# Patient Record
Sex: Male | Born: 1960 | Race: White | Hispanic: No | Marital: Married | Smoking: Former smoker
Health system: Southern US, Community
[De-identification: ages and names within clinical notes are randomized; demographics above are authoritative.]

## PROBLEM LIST (undated history)

## (undated) DIAGNOSIS — I10 Essential (primary) hypertension: Secondary | ICD-10-CM

## (undated) DIAGNOSIS — E119 Type 2 diabetes mellitus without complications: Secondary | ICD-10-CM

## (undated) DIAGNOSIS — N189 Chronic kidney disease, unspecified: Secondary | ICD-10-CM

## (undated) DIAGNOSIS — K760 Fatty (change of) liver, not elsewhere classified: Secondary | ICD-10-CM

## (undated) DIAGNOSIS — I4891 Unspecified atrial fibrillation: Secondary | ICD-10-CM

---

## 2018-04-18 ENCOUNTER — Other Ambulatory Visit: Payer: Self-pay

## 2018-04-18 ENCOUNTER — Inpatient Hospital Stay
Admission: EM | Admit: 2018-04-18 | Discharge: 2018-04-22 | DRG: 176 | Disposition: A | Discharge status: Home / Self Care | Payer: Self-pay | Attending: Internal Medicine | Admitting: Internal Medicine

## 2018-04-18 ENCOUNTER — Emergency Department: Payer: Self-pay

## 2018-04-18 ENCOUNTER — Encounter: Payer: Self-pay | Admitting: *Deleted

## 2018-04-18 DIAGNOSIS — I259 Chronic ischemic heart disease, unspecified: Secondary | ICD-10-CM

## 2018-04-18 DIAGNOSIS — E1122 Type 2 diabetes mellitus with diabetic chronic kidney disease: Secondary | ICD-10-CM | POA: Diagnosis present

## 2018-04-18 DIAGNOSIS — Z882 Allergy status to sulfonamides status: Secondary | ICD-10-CM

## 2018-04-18 DIAGNOSIS — Z23 Encounter for immunization: Secondary | ICD-10-CM

## 2018-04-18 DIAGNOSIS — Z7984 Long term (current) use of oral hypoglycemic drugs: Secondary | ICD-10-CM

## 2018-04-18 DIAGNOSIS — I2692 Saddle embolus of pulmonary artery without acute cor pulmonale: Principal | ICD-10-CM | POA: Diagnosis present

## 2018-04-18 DIAGNOSIS — I25110 Atherosclerotic heart disease of native coronary artery with unstable angina pectoris: Secondary | ICD-10-CM | POA: Diagnosis present

## 2018-04-18 DIAGNOSIS — R079 Chest pain, unspecified: Secondary | ICD-10-CM | POA: Diagnosis present

## 2018-04-18 DIAGNOSIS — E669 Obesity, unspecified: Secondary | ICD-10-CM | POA: Diagnosis present

## 2018-04-18 DIAGNOSIS — I129 Hypertensive chronic kidney disease with stage 1 through stage 4 chronic kidney disease, or unspecified chronic kidney disease: Secondary | ICD-10-CM | POA: Diagnosis present

## 2018-04-18 DIAGNOSIS — Z79899 Other long term (current) drug therapy: Secondary | ICD-10-CM

## 2018-04-18 DIAGNOSIS — Z9289 Personal history of other medical treatment: Secondary | ICD-10-CM

## 2018-04-18 DIAGNOSIS — I82403 Acute embolism and thrombosis of unspecified deep veins of lower extremity, bilateral: Secondary | ICD-10-CM | POA: Diagnosis present

## 2018-04-18 DIAGNOSIS — Z880 Allergy status to penicillin: Secondary | ICD-10-CM

## 2018-04-18 DIAGNOSIS — Z7982 Long term (current) use of aspirin: Secondary | ICD-10-CM

## 2018-04-18 DIAGNOSIS — Z6836 Body mass index (BMI) 36.0-36.9, adult: Secondary | ICD-10-CM

## 2018-04-18 DIAGNOSIS — E781 Pure hyperglyceridemia: Secondary | ICD-10-CM | POA: Diagnosis present

## 2018-04-18 DIAGNOSIS — I480 Paroxysmal atrial fibrillation: Secondary | ICD-10-CM | POA: Diagnosis present

## 2018-04-18 DIAGNOSIS — I214 Non-ST elevation (NSTEMI) myocardial infarction: Secondary | ICD-10-CM | POA: Diagnosis present

## 2018-04-18 DIAGNOSIS — E782 Mixed hyperlipidemia: Secondary | ICD-10-CM

## 2018-04-18 DIAGNOSIS — K760 Fatty (change of) liver, not elsewhere classified: Secondary | ICD-10-CM | POA: Diagnosis present

## 2018-04-18 DIAGNOSIS — E1159 Type 2 diabetes mellitus with other circulatory complications: Secondary | ICD-10-CM

## 2018-04-18 DIAGNOSIS — N179 Acute kidney failure, unspecified: Secondary | ICD-10-CM | POA: Diagnosis present

## 2018-04-18 DIAGNOSIS — I248 Other forms of acute ischemic heart disease: Secondary | ICD-10-CM | POA: Diagnosis present

## 2018-04-18 DIAGNOSIS — D696 Thrombocytopenia, unspecified: Secondary | ICD-10-CM | POA: Diagnosis present

## 2018-04-18 DIAGNOSIS — E1165 Type 2 diabetes mellitus with hyperglycemia: Secondary | ICD-10-CM | POA: Diagnosis present

## 2018-04-18 DIAGNOSIS — N183 Chronic kidney disease, stage 3 (moderate): Secondary | ICD-10-CM | POA: Diagnosis present

## 2018-04-18 DIAGNOSIS — I200 Unstable angina: Secondary | ICD-10-CM

## 2018-04-18 DIAGNOSIS — Z87891 Personal history of nicotine dependence: Secondary | ICD-10-CM

## 2018-04-18 DIAGNOSIS — R609 Edema, unspecified: Secondary | ICD-10-CM

## 2018-04-18 DIAGNOSIS — E785 Hyperlipidemia, unspecified: Secondary | ICD-10-CM | POA: Diagnosis present

## 2018-04-18 HISTORY — DX: Chronic kidney disease, unspecified: N18.9

## 2018-04-18 HISTORY — DX: Unspecified atrial fibrillation: I48.91

## 2018-04-18 HISTORY — DX: Essential (primary) hypertension: I10

## 2018-04-18 HISTORY — DX: Type 2 diabetes mellitus without complications: E11.9

## 2018-04-18 HISTORY — DX: Fatty (change of) liver, not elsewhere classified: K76.0

## 2018-04-18 LAB — LIPID PANEL
CHOLESTEROL: 169 mg/dL (ref 0–200)
HDL: 32 mg/dL — AB (ref >40)
LDL Cholesterol: 70 mg/dL (ref 0–99)
Total CHOL/HDL Ratio: 5.3 RATIO
Triglycerides: 337 mg/dL — ABNORMAL HIGH (ref <150)
VLDL: 67 mg/dL — AB (ref 0–40)

## 2018-04-18 LAB — CBC
HCT: 43 % (ref 39.0–52.0)
Hemoglobin: 15.3 g/dL (ref 13.0–17.0)
MCH: 31.3 pg (ref 26.0–34.0)
MCHC: 35.6 g/dL (ref 30.0–36.0)
MCV: 87.9 fL (ref 80.0–100.0)
Platelets: 122 10*3/uL — ABNORMAL LOW (ref 150–400)
RBC: 4.89 MIL/uL (ref 4.22–5.81)
RDW: 13.4 % (ref 11.5–15.5)
WBC: 6.3 10*3/uL (ref 4.0–10.5)
nRBC: 0 % (ref 0.0–0.2)

## 2018-04-18 LAB — BASIC METABOLIC PANEL
ANION GAP: 9 (ref 5–15)
BUN: 15 mg/dL (ref 6–20)
CHLORIDE: 97 mmol/L — AB (ref 98–111)
CO2: 31 mmol/L (ref 22–32)
Calcium: 9.6 mg/dL (ref 8.9–10.3)
Creatinine, Ser: 1.35 mg/dL — ABNORMAL HIGH (ref 0.61–1.24)
GFR calc Af Amer: >60 mL/min (ref >60)
GFR calc non Af Amer: 56 mL/min — ABNORMAL LOW (ref >60)
GLUCOSE: 331 mg/dL — AB (ref 70–99)
POTASSIUM: 3.7 mmol/L (ref 3.5–5.1)
Sodium: 137 mmol/L (ref 135–145)

## 2018-04-18 LAB — GLUCOSE, CAPILLARY
GLUCOSE-CAPILLARY: 174 mg/dL — AB (ref 70–99)
Glucose-Capillary: 259 mg/dL — ABNORMAL HIGH (ref 70–99)

## 2018-04-18 LAB — TROPONIN I
TROPONIN I: 0.48 ng/mL — AB (ref <0.03)
Troponin I: 0.03 ng/mL (ref <0.03)

## 2018-04-18 LAB — PROTIME-INR
INR: 0.99
PROTHROMBIN TIME: 13 s (ref 11.4–15.2)

## 2018-04-18 LAB — APTT: aPTT: 28 seconds (ref 24–36)

## 2018-04-18 LAB — HEMOGLOBIN A1C
Hgb A1c MFr Bld: 9 % — ABNORMAL HIGH (ref 4.8–5.6)
Mean Plasma Glucose: 211.6 mg/dL

## 2018-04-18 MED ORDER — INSULIN ASPART 100 UNIT/ML ~~LOC~~ SOLN
0.0000 [IU] | Freq: Three times a day (TID) | SUBCUTANEOUS | Status: DC
  Administered 2018-04-18: 3 [IU] via SUBCUTANEOUS
  Administered 2018-04-19 (×3): 8 [IU] via SUBCUTANEOUS
  Administered 2018-04-20: 5 [IU] via SUBCUTANEOUS
  Administered 2018-04-20: 8 [IU] via SUBCUTANEOUS
  Administered 2018-04-20: 3 [IU] via SUBCUTANEOUS
  Administered 2018-04-21 (×2): 5 [IU] via SUBCUTANEOUS
  Administered 2018-04-22: 2 [IU] via SUBCUTANEOUS
  Filled 2018-04-18 (×10): qty 1

## 2018-04-18 MED ORDER — LOSARTAN POTASSIUM-HCTZ 100-25 MG PO TABS: 1.0000 | ORAL_TABLET | Freq: Every day | ORAL | Status: DC

## 2018-04-18 MED ORDER — HEPARIN (PORCINE) 25000 UT/250ML-% IV SOLN
1550.0000 [IU]/h | INTRAVENOUS | Status: DC
  Administered 2018-04-18: 1250 [IU]/h via INTRAVENOUS
  Administered 2018-04-19: 1400 [IU]/h via INTRAVENOUS
  Administered 2018-04-20: 1550 [IU]/h via INTRAVENOUS
  Filled 2018-04-18 (×4): qty 250

## 2018-04-18 MED ORDER — ACETAMINOPHEN 325 MG PO TABS
650.0000 mg | ORAL_TABLET | Freq: Four times a day (QID) | ORAL | Status: DC | PRN
  Administered 2018-04-21: 650 mg via ORAL
  Filled 2018-04-18: qty 2

## 2018-04-18 MED ORDER — INFLUENZA VAC SPLIT QUAD 0.5 ML IM SUSY
0.5000 mL | PREFILLED_SYRINGE | INTRAMUSCULAR | Status: AC
  Administered 2018-04-22: 0.5 mL via INTRAMUSCULAR
  Filled 2018-04-18: qty 0.5

## 2018-04-18 MED ORDER — HEPARIN BOLUS VIA INFUSION
4000.0000 [IU] | Freq: Once | INTRAVENOUS | Status: AC
  Administered 2018-04-18: 4000 [IU] via INTRAVENOUS
  Filled 2018-04-18: qty 4000

## 2018-04-18 MED ORDER — ASPIRIN 81 MG PO CHEW
81.0000 mg | CHEWABLE_TABLET | Freq: Every day | ORAL | Status: DC
  Administered 2018-04-19 – 2018-04-22 (×4): 81 mg via ORAL
  Filled 2018-04-18 (×4): qty 1

## 2018-04-18 MED ORDER — LOSARTAN POTASSIUM 50 MG PO TABS
100.0000 mg | ORAL_TABLET | Freq: Every day | ORAL | Status: DC
  Administered 2018-04-19 – 2018-04-22 (×4): 100 mg via ORAL
  Filled 2018-04-18 (×4): qty 2

## 2018-04-18 MED ORDER — POLYETHYLENE GLYCOL 3350 17 G PO PACK: 17.0000 g | PACK | Freq: Every day | ORAL | Status: DC | PRN

## 2018-04-18 MED ORDER — ONDANSETRON HCL 4 MG/2ML IJ SOLN: 4.0000 mg | Freq: Four times a day (QID) | INTRAMUSCULAR | Status: DC | PRN

## 2018-04-18 MED ORDER — ACETAMINOPHEN 650 MG RE SUPP: 650.0000 mg | Freq: Four times a day (QID) | RECTAL | Status: DC | PRN

## 2018-04-18 MED ORDER — ONDANSETRON HCL 4 MG PO TABS: 4.0000 mg | ORAL_TABLET | Freq: Four times a day (QID) | ORAL | Status: DC | PRN

## 2018-04-18 MED ORDER — ATORVASTATIN CALCIUM 20 MG PO TABS
40.0000 mg | ORAL_TABLET | Freq: Every day | ORAL | Status: DC
  Administered 2018-04-19 – 2018-04-21 (×3): 40 mg via ORAL
  Filled 2018-04-18 (×3): qty 2

## 2018-04-18 MED ORDER — NITROGLYCERIN 0.4 MG SL SUBL: 0.4000 mg | SUBLINGUAL_TABLET | SUBLINGUAL | Status: DC | PRN

## 2018-04-18 MED ORDER — METOPROLOL TARTRATE 50 MG PO TABS
50.0000 mg | ORAL_TABLET | Freq: Two times a day (BID) | ORAL | Status: DC
  Administered 2018-04-18 – 2018-04-22 (×8): 50 mg via ORAL
  Filled 2018-04-18 (×8): qty 1

## 2018-04-18 MED ORDER — ENOXAPARIN SODIUM 40 MG/0.4ML ~~LOC~~ SOLN: 40.0000 mg | SUBCUTANEOUS | Status: DC

## 2018-04-18 MED ORDER — ALBUTEROL SULFATE (2.5 MG/3ML) 0.083% IN NEBU: 2.5000 mg | INHALATION_SOLUTION | RESPIRATORY_TRACT | Status: DC | PRN

## 2018-04-18 MED ORDER — INSULIN ASPART 100 UNIT/ML ~~LOC~~ SOLN
0.0000 [IU] | Freq: Every day | SUBCUTANEOUS | Status: DC
  Administered 2018-04-18: 3 [IU] via SUBCUTANEOUS
  Administered 2018-04-19 – 2018-04-21 (×2): 2 [IU] via SUBCUTANEOUS
  Filled 2018-04-18 (×3): qty 1

## 2018-04-18 MED ORDER — NITROGLYCERIN 0.4 MG SL SUBL
0.4000 mg | SUBLINGUAL_TABLET | Freq: Once | SUBLINGUAL | Status: AC
  Administered 2018-04-18: 0.4 mg via SUBLINGUAL
  Filled 2018-04-18: qty 1

## 2018-04-18 MED ORDER — SODIUM CHLORIDE 0.9% FLUSH
3.0000 mL | Freq: Two times a day (BID) | INTRAVENOUS | Status: DC
  Administered 2018-04-20 – 2018-04-21 (×3): 3 mL via INTRAVENOUS

## 2018-04-18 MED ORDER — HYDROCHLOROTHIAZIDE 25 MG PO TABS: 25.0000 mg | ORAL_TABLET | Freq: Every day | ORAL | Status: DC

## 2018-04-18 NOTE — Progress Notes (Signed)
MD Gollan notified. Pts second troponin is 0.48. Heparin gtt will be started per MD order. I will continue to assess.

## 2018-04-18 NOTE — ED Provider Notes (Signed)
[facility] Emergency Department Provider Note   ____________________________________________    I have reviewed the triage vital signs and the nursing notes.   HISTORY  Chief Complaint Chest Pain and Shortness of Breath     HPI Joshua Ewing is a 57 y.o. male who presents with complaints of chest pain shortness of breath.  Patient reports that he felt well this morning, went to work and was quite busy.  He works at [employer] in town.  He reports he was "running around "doing when needed to be done" and he started to feel mildly short of breath.  He rested for 5 minutes and then continued working and developed a strong pressure-like sensation in his chest.  He sat down again told his manager who gave him 4 baby aspirins after calling our 1.  He has no history of coronary artery disease.  Does have a history of atrial fibrillation.  Is not on blood thinners.  Also has a history of diabetes.  Currently he reports he feels better but still has some chest discomfort.  No radiation   Past Medical History:  Diagnosis Date  . Atrial fibrillation (HCC)   . Hypertension     There are no active problems to display for this patient.     Prior to Admission medications   Medication Sig Start Date End Date Taking? Authorizing Provider  metFORMIN (GLUCOPHAGE-XR) 500 MG 24 hr tablet Take 1,000 mg by mouth every evening.   Yes [provider]     Allergies Patient has no allergy information on record.  No family history on file.  Social History Social History   Tobacco Use  . Smoking status: Former Games developer  . Smokeless tobacco: Former Engineer, water Use Topics  . Alcohol use: Never    Frequency: Never  . Drug use: Never    Review of Systems  Constitutional: No fever/chills Eyes: No visual changes.  ENT: No sore throat. Cardiovascular: As above Respiratory: As above Gastrointestinal: No abdominal pain.  No nausea, no vomiting.     Genitourinary: Negative for dysuria. Musculoskeletal: Negative for back pain. Skin: Negative for rash. Neurological: Negative for headaches or weakness   ____________________________________________   PHYSICAL EXAM:  VITAL SIGNS: ED Triage Vitals  Enc Vitals Group     BP 04/18/18 1310 (!) 154/100     Pulse Rate 04/18/18 1310 (!) 114     Resp 04/18/18 1310 16     Temp 04/18/18 1310 98.2 F (36.8 C)     Temp Source 04/18/18 1310 Oral     SpO2 04/18/18 1310 95 %     Weight 04/18/18 1313 112.8 kg (248 lb 10.9 oz)     Height --      Head Circumference --      Peak Flow --      Pain Score 04/18/18 1310 0     Pain Loc --      Pain Edu? --      Excl. in GC? --     Constitutional: Alert and oriented.  Eyes: Conjunctivae are normal.   Nose: No congestion/rhinnorhea. Mouth/Throat: Mucous membranes are moist.    Cardiovascular: Tachycardia, regular rhythm. Grossly normal heart sounds.  Good peripheral circulation. Respiratory: Normal respiratory effort.  No retractions. Lungs CTAB. Gastrointestinal: Soft and nontender. No distention.   Musculoskeletal: No lower extremity tenderness nor edema.  Warm and well perfused Neurologic:  Normal speech and language. No gross focal neurologic deficits are appreciated.  Skin:  Skin is warm, dry and intact. No rash noted. Psychiatric: Mood and affect are normal. Speech and behavior are normal.  ____________________________________________   LABS (all labs ordered are listed, but only abnormal results are displayed)  Labs Reviewed  BASIC METABOLIC PANEL - Abnormal; Notable for the following components:      Result Value   Chloride 97 (*)    Glucose, Bld 331 (*)    Creatinine, Ser 1.35 (*)    GFR calc non Af Amer 56 (*)    All other components within normal limits  CBC - Abnormal; Notable for the following components:   Platelets 122 (*)    All other components within normal limits  TROPONIN I - Abnormal; Notable for the  following components:   Troponin I 0.03 (*)    All other components within normal limits   ____________________________________________  EKG  ED ECG REPORT I, Jene Every, the attending physician, personally viewed and interpreted this ECG.  Date: 04/18/2018  Rhythm: Sinus tachycardia QRS Axis: normal Intervals: normal ST/T Wave abnormalities: normal Narrative Interpretation: no evidence of acute ischemia  ____________________________________________  RADIOLOGY  Chest x-ray normal ____________________________________________   PROCEDURES  Procedure(s) performed: No  Procedures   Critical Care performed: No ____________________________________________   INITIAL IMPRESSION / ASSESSMENT AND PLAN / ED COURSE  Pertinent labs & imaging results that were available during my care of the patient were reviewed by me and considered in my medical decision making (see chart for details).  Patient well-appearing in no acute distress.  On exam notable for tachycardia.  No pleurisy.  No further shortness of breath.  Troponin mildly elevated at 0.03.  Patient gives a good description of possible angina versus ACS.  Has received aspirin, will give nitroglycerin.  Given history of diabetes, high blood pressure, tachycardia, elevated troponin will admit to the hospital service for further evaluation    ____________________________________________   FINAL CLINICAL IMPRESSION(S) / ED DIAGNOSES  Final diagnoses:  Chest pain due to myocardial ischemia, unspecified ischemic chest pain type        Note:  This document was prepared using Dragon voice recognition software and may include unintentional dictation errors.    Jene Every, MD 04/18/18 [phone]

## 2018-04-18 NOTE — Consult Note (Signed)
ANTICOAGULATION CONSULT NOTE  Pharmacy Consult for heparin management Indication: chest pain/ACS  Allergies  Allergen Reactions  . Penicillins Hives    Has patient had a PCN reaction causing immediate rash, facial/tongue/throat swelling, SOB or lightheadedness with hypotension: No Has patient had a PCN reaction causing severe rash involving mucus membranes or skin necrosis: No Has patient had a PCN reaction that required hospitalization: No Has patient had a PCN reaction occurring within the last 10 years: Unknown If all of the above answers are "NO", then may proceed with Cephalosporin use.   . Sulfa Antibiotics Other (See Comments)    Childhood reaction: unknown    Patient Measurements: Weight: 248 lb 10.9 oz (112.8 kg) Heparin Dosing Weight: 95.7kg  Vital Signs: Temp: 98.5 F (36.9 C) (11/08 1616) Temp Source: Oral (11/08 1616) BP: 122/86 (11/08 1616) Pulse Rate: 102 (11/08 1616)  Labs: Recent Labs    04/18/18 1308 04/18/18 1749  HGB 15.3  --   HCT 43.0  --   PLT 122*  --   CREATININE 1.35*  --   TROPONINI 0.03* 0.48*    CrCl cannot be calculated (Unknown ideal weight.).   Medical History: Past Medical History:  Diagnosis Date  . Atrial fibrillation (HCC)   . CKD (chronic kidney disease)   . Diabetes (HCC)   . Hypertension   . NAFL (nonalcoholic fatty liver)     Assessment: 57 y.o. male with a hx of poorly controlled diabetes, obesity, PAF, HTN, fatty liver, CKD, presented to the hospital with tachycardia, chest pain, shortness of breath.  Initial troponin minimally elevated, repeat troponin is 0.48. He is on no anticoagulants PTA. Baseline labs have been ordered  Goal of Therapy:  Heparin level 0.3-0.7 units/ml Monitor platelets by anticoagulation protocol: Yes   Plan:  Give 4000 units bolus x 1 Start heparin infusion at 1250 units/hr Check anti-Xa level in 6 hours and daily while on heparin Continue to monitor H&H and platelets  Lowella Bandy,  PharmD 04/18/2018,6:47 PM

## 2018-04-18 NOTE — Progress Notes (Signed)
Patient with second troponin of 0.48.  Non-ST elevation MI.  Discussed with Dr. Mariah Milling.  Start heparin drip.  Cardiac catheterization on Monday.

## 2018-04-18 NOTE — ED Notes (Signed)
Date and time results received: 04/18/18 1405 (use smartphrase ".now" to insert current time)  Test: Troponin  Critical Value: 0.03  Name of Provider Notified: Kinner  Orders Received? Or Actions Taken?: No orders received

## 2018-04-18 NOTE — H&P (Signed)
[facility] - [place] [facility]   PATIENT NAME: Alta Shober    MR#:  [id number]  DATE OF BIRTH:  June 11, 1961  DATE OF ADMISSION:  04/18/2018  PRIMARY CARE PHYSICIAN: Marval Regal, DO   REQUESTING/REFERRING PHYSICIAN: Dr. Cyril Loosen  CHIEF COMPLAINT:   Chief Complaint  Patient presents with  . Chest Pain  . Shortness of Breath    HISTORY OF PRESENT ILLNESS:  Lennix Kneisel  is a 57 y.o. male with a known history of HTN, pAfib, DM presented to the emergency room from his workplace due to exertional shortness of breath and chest pain.  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  Work was busy and he was trying to do all he could and with exertional he developed some palpitations initially.  He was moved to a different area in [employer] which is less busy but when he continued to work he developed shortness of breath and chest pressure.  No palpitations.  Patient has had palpitations in the past with atrial fibrillation which resolved with coughing.  Never had chest pain.  This is the first episode.  It he had a normal stress test and echocardiogram in 2003. Recently his blood pressure medications were changed from one arm to another.  PAST MEDICAL HISTORY:   Past Medical History:  Diagnosis Date  . Atrial fibrillation (HCC)   . CKD (chronic kidney disease)   . Diabetes (HCC)   . Hypertension   . NAFL (nonalcoholic fatty liver)     PAST SURGICAL HISTORY:  None SOCIAL HISTORY:   Social History   Tobacco Use  . Smoking status: Former Games developer  . Smokeless tobacco: Former Engineer, water Use Topics  . Alcohol use: Never    Frequency: Never    FAMILY HISTORY:   Family History  Problem Relation Age of Onset  . Hypertension Father     DRUG ALLERGIES:   Allergies  Allergen Reactions  . Penicillins Hives    Has patient had a PCN reaction causing immediate rash, facial/tongue/throat swelling, SOB or  lightheadedness with hypotension: No Has patient had a PCN reaction causing severe rash involving mucus membranes or skin necrosis: No Has patient had a PCN reaction that required hospitalization: No Has patient had a PCN reaction occurring within the last 10 years: Unknown If all of the above answers are "NO", then may proceed with Cephalosporin use.   . Sulfa Antibiotics Other (See Comments)    Childhood reaction: unknown    REVIEW OF SYSTEMS:   Review of Systems  Constitutional: Positive for malaise/fatigue. Negative for chills, fever and weight loss.  HENT: Negative for hearing loss and nosebleeds.   Eyes: Negative for blurred vision, double vision and pain.  Respiratory: Positive for shortness of breath. Negative for cough, hemoptysis, sputum production and wheezing.   Cardiovascular: Positive for chest pain and palpitations. Negative for orthopnea and leg swelling.  Gastrointestinal: Negative for abdominal pain, constipation, diarrhea, nausea and vomiting.  Genitourinary: Negative for dysuria and hematuria.  Musculoskeletal: Negative for back pain, falls and myalgias.  Skin: Negative for rash.  Neurological: Negative for dizziness, tremors, sensory change, speech change, focal weakness, seizures and headaches.  Endo/Heme/Allergies: Does not bruise/bleed easily.  Psychiatric/Behavioral: Negative for depression and memory loss. The patient is not nervous/anxious.     MEDICATIONS AT HOME:   Prior to Admission medications   Medication Sig Start Date End Date Taking? Authorizing Provider  aspirin 81 MG chewable  tablet Chew 81 mg by mouth daily.   Yes [provider]  losartan-hydrochlorothiazide (HYZAAR) 100-25 MG tablet Take 1 tablet by mouth daily. 02/09/18  Yes [provider]  metFORMIN (GLUCOPHAGE-XR) 500 MG 24 hr tablet Take 1,000 mg by mouth every evening.   Yes [provider]     VITAL SIGNS:  Blood pressure 122/88, pulse (!) 119, temperature  98.2 F (36.8 C), temperature source Oral, resp. rate (!) 25, weight 112.8 kg, SpO2 94 %.  PHYSICAL EXAMINATION:  Physical Exam  GENERAL:  58 y.o.-year-old patient lying in the bed with no acute distress.  EYES: Pupils equal, round, reactive to light and accommodation. No scleral icterus. Extraocular muscles intact.  HEENT: Head atraumatic, normocephalic. Oropharynx and nasopharynx clear. No oropharyngeal erythema, moist oral mucosa  NECK:  Supple, no jugular venous distention. No thyroid enlargement, no tenderness.  LUNGS: Normal breath sounds bilaterally, no wheezing, rales, rhonchi. No use of accessory muscles of respiration.  CARDIOVASCULAR: S1, S2 normal. No murmurs, rubs, or gallops.  ABDOMEN: Soft, nontender, nondistended. Bowel sounds present. No organomegaly or mass.  EXTREMITIES: No pedal edema, cyanosis, or clubbing. + 2 pedal & radial pulses b/l.   NEUROLOGIC: Cranial nerves II through XII are intact. No focal Motor or sensory deficits appreciated b/l PSYCHIATRIC: The patient is alert and oriented x 3. Good affect.  SKIN: No obvious rash, lesion, or ulcer.   LABORATORY PANEL:   CBC Recent Labs  Lab 04/18/18 1308  WBC 6.3  HGB 15.3  HCT 43.0  PLT 122*   ------------------------------------------------------------------------------------------------------------------  Chemistries  Recent Labs  Lab 04/18/18 1308  NA 137  K 3.7  CL 97*  CO2 31  GLUCOSE 331*  BUN 15  CREATININE 1.35*  CALCIUM 9.6   ------------------------------------------------------------------------------------------------------------------  Cardiac Enzymes Recent Labs  Lab 04/18/18 1308  TROPONINI 0.03*   ------------------------------------------------------------------------------------------------------------------  RADIOLOGY:  Dg Chest 2 View  Result Date: 04/18/2018 CLINICAL DATA:  Chest tightness which shortness-of-breath today. EXAM: CHEST - 2 VIEW COMPARISON:  None.  FINDINGS: Lungs are adequately inflated without consolidation or effusion. Cardiomediastinal silhouette is normal. There is mild degenerative change of the spine. IMPRESSION: No active cardiopulmonary disease. Electronically Signed   By: Elberta Fortis M.D.   On: 04/18/2018 13:59     IMPRESSION AND PLAN:   *Exertional chest pain.  Concern regarding CAD.  Will admit patient to telemetry floor.  Aspirin.  Nitro as needed.  Consult cardiology.  Cardiac monitoring.  Repeat troponin.  Stress test versus cardiac catheterization per cardiology recommendations.  Inform Dr. Mariah Milling of consult.  *Paroxysmal atrial fibrillation.  Presently in sinus tachycardia up to 120s.  We will start him on metoprolol 50 twice daily.  Aspirin.  *Diabetes mellitus.  On metformin at home but blood sugars are significantly elevated in the 300s.  Check hemoglobin A1c.  Put him on sliding scale insulin.  I have added diet  *Hypertension.  Continue home medications.  DVT prophylaxis with Lovenox  All the records are reviewed and case discussed with ED provider. Management plans discussed with the patient, family and they are in agreement.  CODE STATUS: FULL CODE  TOTAL TIME TAKING CARE OF THIS PATIENT: 40 minutes.   Molinda Bailiff Sharri Loya M.D on 04/18/2018 at 3:40 PM  Between 7am to 6pm - Pager - [phone]  After 6pm go to [email or web address] - password [email or web address] [facility]  [facility] [place] [facility]  Office  [phone]  CC: Primary care physician; Marval Regal, DO  Note: This dictation was prepared with  Dragon dictation along with smaller Company secretary. Any transcriptional errors that result from this process are unintentional.

## 2018-04-18 NOTE — ED Triage Notes (Signed)
Pt to ED via EMS from work reporting CP and SOB upon exertion. No chest pain while sitting in EMS stretcher. Pt able to speak in complete sentences and without distress. Color is appropriate Hx of Afib no other cardiac hx reported.   [address] 182/100 [address] ST

## 2018-04-18 NOTE — Consult Note (Addendum)
Cardiology Consultation:   Patient ID: Joshua Ewing MRN: [id number]; DOB: May 25, 1961  Admit date: 04/18/2018 Date of Consult: 04/18/2018  Primary Care Provider: Marval Regal, DO Primary Cardiologist: new to [facility] Physician requesting consult: dr sudini Reason for consult: Unstable angina symptoms, elevated troponin, NSTEMI    Patient Profile:   Joshua Ewing is a 57 y.o. male with a hx of poorly controlled diabetes, obesity, paroxysmal atrial fibrillation, hypertension, fatty liver, chronic kidney disease, presenting to the hospital with tachycardia, chest pain, shortness of breath.  History of Present Illness:   Joshua Ewing reports that he was working in [employer] and around noon he developed acute shortness of breath, felt his heart rate go faster, chest pain from "pectoral muscle to pectoral muscle".  His boss made him sit down in a quiet part of the restaurant.  Symptoms seem to improve but still had shortness of breath and elevated heart rate.  Heart rate not markedly fast concerning for atrial fibrillation, just more than normal.  Continue to work and had recurrence of his shortness of breath.  His boss called 911.   In the emergency room noted to have sinus tachycardia, heart rate 114 bpm, hypertensive 154/100 Initial creatinine 1.35 with a glucose 331 Initial troponin 0 0.03  Admitted to the hospital for further work-up, rule out MI  At baseline no regular exercise, works in [employer] business Poor diet   Past Medical History:  Diagnosis Date  . Atrial fibrillation (HCC)   . CKD (chronic kidney disease)   . Diabetes (HCC)   . Hypertension   . NAFL (nonalcoholic fatty liver)     History reviewed. No pertinent surgical history.   Home Medications:  Prior to Admission medications   Medication Sig Start Date End Date Taking? Authorizing Provider  aspirin 81 MG chewable tablet Chew 81 mg by mouth daily.   Yes [provider]    losartan-hydrochlorothiazide (HYZAAR) 100-25 MG tablet Take 1 tablet by mouth daily. 02/09/18  Yes [provider]  metFORMIN (GLUCOPHAGE-XR) 500 MG 24 hr tablet Take 1,000 mg by mouth every evening.   Yes [provider]    Inpatient Medications: Scheduled Meds: . [START ON 04/19/2018] aspirin  81 mg Oral Daily  . enoxaparin (LOVENOX) injection  40 mg Subcutaneous Q24H  . [START ON 04/19/2018] hydrochlorothiazide  25 mg Oral Daily  . [START ON 04/19/2018] Influenza vac split quadrivalent PF  0.5 mL Intramuscular Tomorrow-1000  . insulin aspart  0-15 Units Subcutaneous TID WC  . insulin aspart  0-5 Units Subcutaneous QHS  . [START ON 04/19/2018] losartan  100 mg Oral Daily  . metoprolol tartrate  50 mg Oral BID  . sodium chloride flush  3 mL Intravenous Q12H   Continuous Infusions:  PRN Meds: acetaminophen **OR** acetaminophen, albuterol, nitroGLYCERIN, ondansetron **OR** ondansetron (ZOFRAN) IV, polyethylene glycol  Allergies:    Allergies  Allergen Reactions  . Penicillins Hives    Has patient had a PCN reaction causing immediate rash, facial/tongue/throat swelling, SOB or lightheadedness with hypotension: No Has patient had a PCN reaction causing severe rash involving mucus membranes or skin necrosis: No Has patient had a PCN reaction that required hospitalization: No Has patient had a PCN reaction occurring within the last 10 years: Unknown If all of the above answers are "NO", then may proceed with Cephalosporin use.   . Sulfa Antibiotics Other (See Comments)    Childhood reaction: unknown    Social History:   Social History   Socioeconomic History  .  Marital status: Married    Spouse name: Not on file  . Number of children: Not on file  . Years of education: Not on file  . Highest education level: Not on file  Occupational History  . Not on file  Social Needs  . Financial resource strain: Not on file  . Food insecurity:    Worry: Not on file     Inability: Not on file  . Transportation needs:    Medical: Not on file    Non-medical: Not on file  Tobacco Use  . Smoking status: Former Games developer  . Smokeless tobacco: Former Engineer, water and Sexual Activity  . Alcohol use: Never    Frequency: Never  . Drug use: Never  . Sexual activity: Yes  Lifestyle  . Physical activity:    Days per week: Not on file    Minutes per session: Not on file  . Stress: Not on file  Relationships  . Social connections:    Talks on phone: Not on file    Gets together: Not on file    Attends religious service: Not on file    Active member of club or organization: Not on file    Attends meetings of clubs or organizations: Not on file    Relationship status: Not on file  . Intimate partner violence:    Fear of current or ex partner: Not on file    Emotionally abused: Not on file    Physically abused: Not on file    Forced sexual activity: Not on file  Other Topics Concern  . Not on file  Social History Narrative  . Not on file    Family History:    Family History  Problem Relation Age of Onset  . Hypertension Father      ROS:  Please see the history of present illness.  Review of Systems  Constitutional: Negative.   Respiratory: Positive for shortness of breath.   Cardiovascular: Positive for chest pain and palpitations.  Gastrointestinal: Negative.   Musculoskeletal: Negative.   Neurological: Negative.   Psychiatric/Behavioral: Negative.   All other systems reviewed and are negative.    Physical Exam/Data:   Vitals:   04/18/18 1313 04/18/18 1400 04/18/18 1500 04/18/18 1616  BP:  (!) 138/98 122/88 122/86  Pulse:  (!) 110 (!) 119 (!) 102  Resp:   (!) 25 20  Temp:    98.5 F (36.9 C)  TempSrc:    Oral  SpO2:  94% 94% 99%  Weight: 112.8 kg      No intake or output data in the 24 hours ending 04/18/18 1830 Filed Weights   04/18/18 1313  Weight: 112.8 kg    General:  Well nourished, well developed, in no acute  distress HEENT: normal Lymph: no adenopathy Neck: no JVD Endocrine:  No thryomegaly Vascular: No carotid bruits; FA pulses 2+ bilaterally without bruits  Cardiac:  normal S1, S2; RRR; no murmur  Lungs:  clear to auscultation bilaterally, no wheezing, rhonchi or rales  Abd: soft, nontender, no hepatomegaly  Ext: no edema Musculoskeletal:  No deformities, BUE and BLE strength normal and equal Skin: warm and dry  Neuro:  CNs 2-12 intact, no focal abnormalities noted Psych:  Normal affect   EKG:  The EKG was personally reviewed and demonstrates:   Sinus tachycardia rate 113 bpm poor R wave progression to anterior precordial leads, nonspecific ST abnormality Telemetry:  Telemetry was personally reviewed and demonstrates:   Sinus tachycardia rate 100  Relevant CV Studies: Echo pending  Laboratory Data:  Chemistry Recent Labs  Lab 04/18/18 1308  NA 137  K 3.7  CL 97*  CO2 31  GLUCOSE 331*  BUN 15  CREATININE 1.35*  CALCIUM 9.6  GFRNONAA 56*  GFRAA >60  ANIONGAP 9    No results for input(s): PROT, ALBUMIN, AST, ALT, ALKPHOS, BILITOT in the last 168 hours. Hematology Recent Labs  Lab 04/18/18 1308  WBC 6.3  RBC 4.89  HGB 15.3  HCT 43.0  MCV 87.9  MCH 31.3  MCHC 35.6  RDW 13.4  PLT 122*   Cardiac Enzymes Recent Labs  Lab 04/18/18 1308 04/18/18 1749  TROPONINI 0.03* 0.48*   No results for input(s): TROPIPOC in the last 168 hours.  BNPNo results for input(s): BNP, PROBNP in the last 168 hours.  DDimer No results for input(s): DDIMER in the last 168 hours.  Radiology/Studies:  Dg Chest 2 View  Result Date: 04/18/2018 CLINICAL DATA:  Chest tightness which shortness-of-breath today. EXAM: CHEST - 2 VIEW COMPARISON:  None. FINDINGS: Lungs are adequately inflated without consolidation or effusion. Cardiomediastinal silhouette is normal. There is mild degenerative change of the spine. IMPRESSION: No active cardiopulmonary disease. Electronically Signed   By: Elberta Fortis M.D.   On: 04/18/2018 13:59    Assessment and Plan:   1. NSTEMI Initial troponin minimally elevated, repeat troponin he has ruled in Second troponin  0.48 Stress test which was ordered by myself has been canceled I placed order for heparin infusion per pharmacy for ACS Risk factors include poorly controlled diabetes. I have reviewed the risks, indications, and alternatives to cardiac catheterization, possible angioplasty, and stenting with the patient. Risks include but are not limited to bleeding, infection, vascular injury, stroke, myocardial infection, arrhythmia, kidney injury, radiation-related injury in the case of prolonged fluoroscopy use, emergency cardiac surgery, and death. The patient understands the risks of serious complication is 1-2 in 1000 with diagnostic cardiac cath and 1-2% or less with angioplasty/stenting.  He will be scheduled first case on Monday with Dr. Kirke Corin. He has been placed on the board in preparation -Echocardiogram ordered, pending  2) type 2 diabetes, poorly controlled with complications Stressed importance of better diet, weight loss Needs to work closely with primary care He was told by primary care A1c was elevated, this is still pending  3) chronic renal insufficiency Creatinine 1.3 Will need to monitor through the weekend, may need hydration Sunday night into Monday   Total encounter time more than 110 minutes  Greater than 50% was spent in counseling and coordination of care with the patient   For questions or updates, please contact [facility] Please consult [email or web address] for contact info under     Signed, Julien Nordmann, MD  04/18/2018 6:30 PM

## 2018-04-19 ENCOUNTER — Inpatient Hospital Stay
Admit: 2018-04-19 | Discharge: 2018-04-19 | Disposition: A | Discharge status: Home / Self Care | Payer: Self-pay | Attending: Cardiovascular Disease | Admitting: Cardiovascular Disease

## 2018-04-19 DIAGNOSIS — I213 ST elevation (STEMI) myocardial infarction of unspecified site: Secondary | ICD-10-CM

## 2018-04-19 LAB — CBC
HEMATOCRIT: 38.8 % — AB (ref 39.0–52.0)
HEMOGLOBIN: 13.8 g/dL (ref 13.0–17.0)
MCH: 31.3 pg (ref 26.0–34.0)
MCHC: 35.6 g/dL (ref 30.0–36.0)
MCV: 88 fL (ref 80.0–100.0)
NRBC: 0 % (ref 0.0–0.2)
PLATELETS: 115 10*3/uL — AB (ref 150–400)
RBC: 4.41 MIL/uL (ref 4.22–5.81)
RDW: 13.6 % (ref 11.5–15.5)
WBC: 7.9 10*3/uL (ref 4.0–10.5)

## 2018-04-19 LAB — HEPARIN LEVEL (UNFRACTIONATED)
HEPARIN UNFRACTIONATED: 0.4 [IU]/mL (ref 0.30–0.70)
Heparin Unfractionated: 0.27 IU/mL — ABNORMAL LOW (ref 0.30–0.70)
Heparin Unfractionated: 0.46 IU/mL (ref 0.30–0.70)

## 2018-04-19 LAB — TROPONIN I: Troponin I: 0.48 ng/mL (ref <0.03)

## 2018-04-19 LAB — GLUCOSE, CAPILLARY
GLUCOSE-CAPILLARY: 259 mg/dL — AB (ref 70–99)
Glucose-Capillary: 253 mg/dL — ABNORMAL HIGH (ref 70–99)
Glucose-Capillary: 252 mg/dL — ABNORMAL HIGH (ref 70–99)
Glucose-Capillary: 225 mg/dL — ABNORMAL HIGH (ref 70–99)

## 2018-04-19 LAB — HEMOGLOBIN A1C
HEMOGLOBIN A1C: 8.9 % — AB (ref 4.8–5.6)
Mean Plasma Glucose: 208.73 mg/dL

## 2018-04-19 MED ORDER — HEPARIN BOLUS VIA INFUSION
1300.0000 [IU] | Freq: Once | INTRAVENOUS | Status: AC
  Administered 2018-04-19: 1300 [IU] via INTRAVENOUS
  Filled 2018-04-19: qty 1300

## 2018-04-19 MED ORDER — INSULIN GLARGINE 100 UNIT/ML ~~LOC~~ SOLN
8.0000 [IU] | Freq: Every day | SUBCUTANEOUS | Status: DC
  Administered 2018-04-19 – 2018-04-20 (×2): 8 [IU] via SUBCUTANEOUS
  Filled 2018-04-19 (×3): qty 0.08

## 2018-04-19 NOTE — Plan of Care (Signed)
  Problem: Pain Managment: Goal: General experience of comfort will improve Outcome: Progressing   Problem: Safety: Goal: Ability to remain free from injury will improve Outcome: Progressing   Problem: Activity: Goal: Ability to tolerate increased activity will improve Outcome: Progressing   

## 2018-04-19 NOTE — Consult Note (Signed)
ANTICOAGULATION CONSULT NOTE  Pharmacy Consult for heparin management Indication: chest pain/ACS  Allergies  Allergen Reactions  . Penicillins Hives    Has patient had a PCN reaction causing immediate rash, facial/tongue/throat swelling, SOB or lightheadedness with hypotension: No Has patient had a PCN reaction causing severe rash involving mucus membranes or skin necrosis: No Has patient had a PCN reaction that required hospitalization: No Has patient had a PCN reaction occurring within the last 10 years: Unknown If all of the above answers are "NO", then may proceed with Cephalosporin use.   . Sulfa Antibiotics Other (See Comments)    Childhood reaction: unknown    Patient Measurements: Height: 5\' 9"  (175.3 cm) Weight: 248 lb 10.9 oz (112.8 kg) IBW/kg (Calculated) : 70.7 Heparin Dosing Weight: 95.7kg  Vital Signs: Temp: 98.3 F (36.8 C) (11/08 2018) Temp Source: Oral (11/08 2018) BP: 123/94 (11/08 2018) Pulse Rate: 104 (11/08 2018)  Labs: Recent Labs    04/18/18 1308 04/18/18 1749 04/18/18 1925 04/19/18 0014 04/19/18 0204  HGB 15.3  --   --   --   --   HCT 43.0  --   --   --   --   PLT 122*  --   --   --   --   APTT  --   --  28  --   --   LABPROT  --   --  13.0  --   --   INR  --   --  0.99  --   --   HEPARINUNFRC  --   --   --   --  0.27*  CREATININE 1.35*  --   --   --   --   TROPONINI 0.03* 0.48*  --  0.48*  --     Estimated Creatinine Clearance: 72.9 mL/min (A) (by C-G formula based on SCr of 1.35 mg/dL (H)).   Medical History: Past Medical History:  Diagnosis Date  . Atrial fibrillation (HCC)   . CKD (chronic kidney disease)   . Diabetes (HCC)   . Hypertension   . NAFL (nonalcoholic fatty liver)     Assessment: 57 y.o. male with a hx of poorly controlled diabetes, obesity, PAF, HTN, fatty liver, CKD, presented to the hospital with tachycardia, chest pain, shortness of breath.  Initial troponin minimally elevated, repeat troponin is 0.48. He is  on no anticoagulants PTA. Baseline labs have been ordered  Goal of Therapy:  Heparin level 0.3-0.7 units/ml Monitor platelets by anticoagulation protocol: Yes   Plan:  11/09 @ 0330 HL 0.27 subtherapeutic. Will rebolus w/ heparin 1300 units IV x 1 and increase rate to 1400 units/hr and will recheck HL @ 0900, will f/u w/ CBC w/ am labs. trops elevated at 0.48 will continue to monitor.  Thomasene Ripple, PharmD, BCPS Clinical Pharmacist 04/19/2018

## 2018-04-19 NOTE — Progress Notes (Signed)
Patient ID: Joshua Ewing, male   DOB: [birth year], 59 y.o.   MRN: [id number]  [employer] PROGRESS NOTE  Cheron Pasquarelli WUJ:[id number] DOB: 1961-02-09 DOA: 04/18/2018 PCP: Marval Regal, DO  HPI/Subjective: Patient feeling better now.  Came in had some shortness of breath and pressure in the chest.  Right now feeling a lot better.  Objective: Vitals:   04/19/18 0353 04/19/18 0930  BP: 119/80 126/77  Pulse: 96 (!) 105  Resp: 18 14  Temp: 97.6 F (36.4 C)   SpO2: 93% 96%    Intake/Output Summary (Last 24 hours) at 04/19/2018 1540 Last data filed at 04/19/2018 1406 Gross per 24 hour  Intake 1909.48 ml  Output 0 ml  Net 1909.48 ml   Filed Weights   04/18/18 1313  Weight: 112.8 kg    ROS: Review of Systems  Constitutional: Negative for chills and fever.  Eyes: Negative for blurred vision.  Respiratory: Negative for cough and shortness of breath.   Cardiovascular: Negative for chest pain.  Gastrointestinal: Negative for abdominal pain, constipation, diarrhea, nausea and vomiting.  Genitourinary: Negative for dysuria.  Musculoskeletal: Negative for joint pain.  Neurological: Negative for dizziness and headaches.   Exam: Physical Exam  Constitutional: He is oriented to person, place, and time.  HENT:  Nose: No mucosal edema.  Mouth/Throat: No oropharyngeal exudate or posterior oropharyngeal edema.  Eyes: Pupils are equal, round, and reactive to light. Conjunctivae, EOM and lids are normal.  Neck: No JVD present. Carotid bruit is not present. No edema present. No thyroid mass and no thyromegaly present.  Cardiovascular: S1 normal and S2 normal. Exam reveals no gallop.  No murmur heard. Pulses:      Dorsalis pedis pulses are 2+ on the right side, and 2+ on the left side.  Respiratory: No respiratory distress. He has no wheezes. He has no rhonchi. He has no rales.  GI: Soft. Bowel sounds are normal. There is no tenderness.  Musculoskeletal:       Right ankle: He  exhibits swelling.       Left ankle: He exhibits swelling.  Lymphadenopathy:    He has no cervical adenopathy.  Neurological: He is alert and oriented to person, place, and time. No cranial nerve deficit.  Skin: Skin is warm. No rash noted. Nails show no clubbing.  Psychiatric: He has a normal mood and affect.      Data Reviewed: Basic Metabolic Panel: Recent Labs  Lab 04/18/18 1308  NA 137  K 3.7  CL 97*  CO2 31  GLUCOSE 331*  BUN 15  CREATININE 1.35*  CALCIUM 9.6   CBC: Recent Labs  Lab 04/18/18 1308 04/19/18 0552  WBC 6.3 7.9  HGB 15.3 13.8  HCT 43.0 38.8*  MCV 87.9 88.0  PLT 122* 115*   Cardiac Enzymes: Recent Labs  Lab 04/18/18 1308 04/18/18 1749 04/19/18 0014  TROPONINI 0.03* 0.48* 0.48*    CBG: Recent Labs  Lab 04/18/18 1747 04/18/18 2024 04/19/18 0800  GLUCAP 174* 259* 253*     Studies: Dg Chest 2 View  Result Date: 04/18/2018 CLINICAL DATA:  Chest tightness which shortness-of-breath today. EXAM: CHEST - 2 VIEW COMPARISON:  None. FINDINGS: Lungs are adequately inflated without consolidation or effusion. Cardiomediastinal silhouette is normal. There is mild degenerative change of the spine. IMPRESSION: No active cardiopulmonary disease. Electronically Signed   By: Elberta Fortis M.D.   On: 04/18/2018 13:59    Scheduled Meds: . aspirin  81 mg Oral Daily  . atorvastatin  40  mg Oral q1800  . Influenza vac split quadrivalent PF  0.5 mL Intramuscular Tomorrow-1000  . insulin aspart  0-15 Units Subcutaneous TID WC  . insulin aspart  0-5 Units Subcutaneous QHS  . losartan  100 mg Oral Daily  . metoprolol tartrate  50 mg Oral BID  . sodium chloride flush  3 mL Intravenous Q12H   Continuous Infusions: . heparin 1,400 Units/hr (04/19/18 1139)    Assessment/Plan:  1. NSTEMI.  Patient on aspirin, atorvastatin metoprolol and heparin drip.  Echocardiogram ordered but still pending.  Cardiac catheterization likely on Monday. 2. Type 2 diabetes  mellitus.  Glucophage on hold.  Add on hemoglobin A1c.  On insulin sliding scale currently.  Add low-dose Lantus at night 3. Hypertension on losartan and metoprolol 4. Hyperlipidemia unspecified.  LDL at goal on atorvastatin now.  Triglycerides elevated at 337.  Code Status:     Code Status Orders  (From admission, onward)         Start     Ordered   04/18/18 1538  Full code  Continuous     04/18/18 1539        Code Status History    This patient has a current code status but no historical code status.     Disposition Plan: Potential discharge Monday afternoon if cath negative potential discharge Tuesday if a stent placed on Monday.  Consultants:  Cardiology  Time spent: 28 minutes  Alphonsus Doyel [employer]

## 2018-04-19 NOTE — Progress Notes (Signed)
   Progress Note   Subjective   Doing well today, the patient denies CP or SOB.  No new concerns  Inpatient Medications    Scheduled Meds: . aspirin  81 mg Oral Daily  . atorvastatin  40 mg Oral q1800  . Influenza vac split quadrivalent PF  0.5 mL Intramuscular Tomorrow-1000  . insulin aspart  0-15 Units Subcutaneous TID WC  . insulin aspart  0-5 Units Subcutaneous QHS  . losartan  100 mg Oral Daily  . metoprolol tartrate  50 mg Oral BID  . sodium chloride flush  3 mL Intravenous Q12H   Continuous Infusions: . heparin 1,400 Units/hr (04/19/18 1139)   PRN Meds: acetaminophen **OR** acetaminophen, albuterol, nitroGLYCERIN, ondansetron **OR** ondansetron (ZOFRAN) IV, polyethylene glycol   Vital Signs    Vitals:   04/18/18 2018 04/19/18 0300 04/19/18 0353 04/19/18 0930  BP: (!) 123/94  119/80 126/77  Pulse: (!) 104  96 (!) 105  Resp: 18  18 14   Temp: 98.3 F (36.8 C)  97.6 F (36.4 C)   TempSrc: Oral  Oral   SpO2: 93%  93% 96%  Weight:      Height:  5\' 9"  (1.753 m)      Intake/Output Summary (Last 24 hours) at 04/19/2018 1224 Last data filed at 04/19/2018 1025 Gross per 24 hour  Intake 1669.48 ml  Output 0 ml  Net 1669.48 ml   Filed Weights   04/18/18 1313  Weight: 112.8 kg    Telemetry    sinus - Personally Reviewed  Physical Exam   GEN- The patient is well appearing, alert and oriented x 3 today.   Head- normocephalic, atraumatic Eyes-  Sclera clear, conjunctiva pink Ears- hearing intact Oropharynx- clear Neck- supple, Lungs- Clear to ausculation bilaterally, normal work of breathing Heart- Regular rate and rhythm  GI- soft, NT, ND, + BS Extremities- no clubbing, cyanosis, or edema  MS- no significant deformity or atrophy Skin- no rash or lesion Psych- euthymic mood, full affect Neuro- strength and sensation are intact   Labs    Chemistry Recent Labs  Lab 04/18/18 1308  NA 137  K 3.7  CL 97*  CO2 31  GLUCOSE 331*  BUN 15  CREATININE  1.35*  CALCIUM 9.6  GFRNONAA 56*  GFRAA >60  ANIONGAP 9     Hematology Recent Labs  Lab 04/18/18 1308 04/19/18 0552  WBC 6.3 7.9  RBC 4.89 4.41  HGB 15.3 13.8  HCT 43.0 38.8*  MCV 87.9 88.0  MCH 31.3 31.3  MCHC 35.6 35.6  RDW 13.4 13.6  PLT 122* 115*    Cardiac Enzymes Recent Labs  Lab 04/18/18 1308 04/18/18 1749 04/19/18 0014  TROPONINI 0.03* 0.48* 0.48*   No results for input(s): TROPIPOC in the last 168 hours.     Patient Profile:   Joshua Ewing is a 57 y.o. male with a hx of poorly controlled diabetes, obesity, paroxysmal atrial fibrillation, hypertension, fatty liver, chronic kidney disease, presenting to the hospital with tachycardia, chest pain, shortness of breath concerning for ACS.  Assessment & Plan    1.  NSTEMI Continue IV heparin and close management over the weekend Currently without chest pain Planned for cath on Monday Echo pending  2. DM type 2 Elevated blood sugars Does not regularly follow at home Primary team to manage Would advise lifestyle modification and better glycemic control long term  3. CRI Stable No change required today   Hillis Range MD, [facility] 04/19/2018 12:24 PM

## 2018-04-19 NOTE — Plan of Care (Signed)
Pt denies chest pain this shift. Heparin gtt infusing at 14gtt/min. BM today. Good appetite. Has been ambulatory around the unit with no complaints or issues.

## 2018-04-19 NOTE — Consult Note (Signed)
ANTICOAGULATION CONSULT NOTE  Pharmacy Consult for heparin management Indication: chest pain/ACS  Patient Measurements: Height: 5\' 9"  (175.3 cm) Weight: 248 lb 10.9 oz (112.8 kg) IBW/kg (Calculated) : 70.7 Heparin Dosing Weight: 95.7kg  Vital Signs: BP: 126/77 (11/09 0930) Pulse Rate: 105 (11/09 0930)  Labs: Recent Labs    04/18/18 1308 04/18/18 1749 04/18/18 1925 04/19/18 0014 04/19/18 0204 04/19/18 0552 04/19/18 1008  HGB 15.3  --   --   --   --  13.8  --   HCT 43.0  --   --   --   --  38.8*  --   PLT 122*  --   --   --   --  115*  --   APTT  --   --  28  --   --   --   --   LABPROT  --   --  13.0  --   --   --   --   INR  --   --  0.99  --   --   --   --   HEPARINUNFRC  --   --   --   --  0.27*  --  0.46  CREATININE 1.35*  --   --   --   --   --   --   TROPONINI 0.03* 0.48*  --  0.48*  --   --   --     Estimated Creatinine Clearance: 72.9 mL/min (A) (by C-G formula based on SCr of 1.35 mg/dL (H)).   Medical History: Past Medical History:  Diagnosis Date  . Atrial fibrillation (HCC)   . CKD (chronic kidney disease)   . Diabetes (HCC)   . Hypertension   . NAFL (nonalcoholic fatty liver)     Assessment: 57 y.o. male with a hx of poorly controlled diabetes, obesity, PAF, HTN, fatty liver, CKD, presented to the hospital with tachycardia, chest pain, shortness of breath.  Initial troponin minimally elevated, repeat troponin was 0.48. He was on no anticoagulants PTA. Hgb has dropped slightly, will continue to follow.  Heparin Course: 11/8 PM: 4000 unit bolus, then 1250 units/hr 11/9 0330 HL 0.27: bolus 1300 units, then increase to 1400 units/hr 11/9 1008 HL 0.46 11/9 1621 HL 0.40  Goal of Therapy:  Heparin level 0.3-0.7 units/ml Monitor platelets by anticoagulation protocol: Yes   Plan:  Second consecutive heparin level therapeutic at 0.40. Continue heparin at same rate 1400 units/hr and recheck in am. CBC in am  Burnis Medin, PharmD Clinical  Pharmacist 04/19/2018

## 2018-04-19 NOTE — Consult Note (Signed)
ANTICOAGULATION CONSULT NOTE  Pharmacy Consult for heparin management Indication: chest pain/ACS  Allergies  Allergen Reactions  . Penicillins Hives    Has patient had a PCN reaction causing immediate rash, facial/tongue/throat swelling, SOB or lightheadedness with hypotension: No Has patient had a PCN reaction causing severe rash involving mucus membranes or skin necrosis: No Has patient had a PCN reaction that required hospitalization: No Has patient had a PCN reaction occurring within the last 10 years: Unknown If all of the above answers are "NO", then may proceed with Cephalosporin use.   . Sulfa Antibiotics Other (See Comments)    Childhood reaction: unknown    Patient Measurements: Height: 5\' 9"  (175.3 cm) Weight: 248 lb 10.9 oz (112.8 kg) IBW/kg (Calculated) : 70.7 Heparin Dosing Weight: 95.7kg  Vital Signs: Temp: 97.6 F (36.4 C) (11/09 0353) Temp Source: Oral (11/09 0353) BP: 126/77 (11/09 0930) Pulse Rate: 105 (11/09 0930)  Labs: Recent Labs    04/18/18 1308 04/18/18 1749 04/18/18 1925 04/19/18 0014 04/19/18 0204 04/19/18 0552 04/19/18 1008  HGB 15.3  --   --   --   --  13.8  --   HCT 43.0  --   --   --   --  38.8*  --   PLT 122*  --   --   --   --  115*  --   APTT  --   --  28  --   --   --   --   LABPROT  --   --  13.0  --   --   --   --   INR  --   --  0.99  --   --   --   --   HEPARINUNFRC  --   --   --   --  0.27*  --  0.46  CREATININE 1.35*  --   --   --   --   --   --   TROPONINI 0.03* 0.48*  --  0.48*  --   --   --     Estimated Creatinine Clearance: 72.9 mL/min (A) (by C-G formula based on SCr of 1.35 mg/dL (H)).   Medical History: Past Medical History:  Diagnosis Date  . Atrial fibrillation (HCC)   . CKD (chronic kidney disease)   . Diabetes (HCC)   . Hypertension   . NAFL (nonalcoholic fatty liver)     Assessment: 57 y.o. male with a hx of poorly controlled diabetes, obesity, PAF, HTN, fatty liver, CKD, presented to the hospital  with tachycardia, chest pain, shortness of breath.  Initial troponin minimally elevated, repeat troponin is 0.48. He is on no anticoagulants PTA. Baseline labs have been ordered  Goal of Therapy:  Heparin level 0.3-0.7 units/ml Monitor platelets by anticoagulation protocol: Yes   Plan:  Heparin level therapeutic at 0.46. Continue heparin at same rate 1400 units/hr and recheck in 6 hours for confirmation.  Olene Floss, Pharm.D, BCPS Clinical Pharmacist 04/19/2018

## 2018-04-20 LAB — CBC
HCT: 40.1 % (ref 39.0–52.0)
HEMOGLOBIN: 14.1 g/dL (ref 13.0–17.0)
MCH: 31.5 pg (ref 26.0–34.0)
MCHC: 35.2 g/dL (ref 30.0–36.0)
MCV: 89.5 fL (ref 80.0–100.0)
NRBC: 0 % (ref 0.0–0.2)
PLATELETS: 119 10*3/uL — AB (ref 150–400)
RBC: 4.48 MIL/uL (ref 4.22–5.81)
RDW: 13.4 % (ref 11.5–15.5)
WBC: 7.9 10*3/uL (ref 4.0–10.5)

## 2018-04-20 LAB — HEPARIN LEVEL (UNFRACTIONATED)
HEPARIN UNFRACTIONATED: 0.28 [IU]/mL — AB (ref 0.30–0.70)
HEPARIN UNFRACTIONATED: 0.51 [IU]/mL (ref 0.30–0.70)
Heparin Unfractionated: 0.38 IU/mL (ref 0.30–0.70)

## 2018-04-20 LAB — GLUCOSE, CAPILLARY
GLUCOSE-CAPILLARY: 205 mg/dL — AB (ref 70–99)
Glucose-Capillary: 261 mg/dL — ABNORMAL HIGH (ref 70–99)
Glucose-Capillary: 176 mg/dL — ABNORMAL HIGH (ref 70–99)
Glucose-Capillary: 188 mg/dL — ABNORMAL HIGH (ref 70–99)

## 2018-04-20 MED ORDER — HEPARIN BOLUS VIA INFUSION
1000.0000 [IU] | Freq: Once | INTRAVENOUS | Status: AC
  Administered 2018-04-20: 1000 [IU] via INTRAVENOUS
  Filled 2018-04-20: qty 1000

## 2018-04-20 NOTE — H&P (View-Only) (Signed)
Progress Note   Subjective   Doing well today, the patient denies CP or SOB.  No new concerns  Inpatient Medications    Scheduled Meds: . aspirin  81 mg Oral Daily  . atorvastatin  40 mg Oral q1800  . Influenza vac split quadrivalent PF  0.5 mL Intramuscular Tomorrow-1000  . insulin aspart  0-15 Units Subcutaneous TID WC  . insulin aspart  0-5 Units Subcutaneous QHS  . insulin glargine  8 Units Subcutaneous QHS  . losartan  100 mg Oral Daily  . metoprolol tartrate  50 mg Oral BID  . sodium chloride flush  3 mL Intravenous Q12H   Continuous Infusions: . heparin 1,550 Units/hr (04/20/18 0737)   PRN Meds: acetaminophen **OR** acetaminophen, albuterol, nitroGLYCERIN, ondansetron **OR** ondansetron (ZOFRAN) IV, polyethylene glycol   Vital Signs    Vitals:   04/19/18 1647 04/19/18 2032 04/20/18 0547 04/20/18 0904  BP: (!) 137/93 117/79 126/82 134/87  Pulse: 92 88 79   Resp: 14 17 17 18   Temp:  98.4 F (36.9 C) 97.6 F (36.4 C) 98.4 F (36.9 C)  TempSrc:  Oral Oral Oral  SpO2: 94% 94% 92% 96%  Weight:      Height:        Intake/Output Summary (Last 24 hours) at 04/20/2018 1428 Last data filed at 04/20/2018 1404 Gross per 24 hour  Intake 844.06 ml  Output 0 ml  Net 844.06 ml   Filed Weights   04/18/18 1313  Weight: 112.8 kg    Telemetry    sinus - Personally Reviewed  Physical Exam   GEN- The patient is well appearing, alert and oriented x 3 today.   Head- normocephalic, atraumatic Eyes-  Sclera clear, conjunctiva pink Ears- hearing intact Oropharynx- clear Neck- supple, Lungs- Clear to ausculation bilaterally, normal work of breathing Heart- Regular rate and rhythm  GI- soft, NT, ND, + BS Extremities- no clubbing, cyanosis, or edema  MS- no significant deformity or atrophy Skin- no rash or lesion Psych- euthymic mood, full affect Neuro- strength and sensation are intact   Labs    Chemistry Recent Labs  Lab 04/18/18 1308  NA 137  K 3.7    CL 97*  CO2 31  GLUCOSE 331*  BUN 15  CREATININE 1.35*  CALCIUM 9.6  GFRNONAA 56*  GFRAA >60  ANIONGAP 9     Hematology Recent Labs  Lab 04/18/18 1308 04/19/18 0552 04/20/18 0516  WBC 6.3 7.9 7.9  RBC 4.89 4.41 4.48  HGB 15.3 13.8 14.1  HCT 43.0 38.8* 40.1  MCV 87.9 88.0 89.5  MCH 31.3 31.3 31.5  MCHC 35.6 35.6 35.2  RDW 13.4 13.6 13.4  PLT 122* 115* 119*    Cardiac Enzymes Recent Labs  Lab 04/18/18 1308 04/18/18 1749 04/19/18 0014  TROPONINI 0.03* 0.48* 0.48*   No results for input(s): TROPIPOC in the last 168 hours.       Patient Profile:   Joshua Ewing a 57 y.o.malewith a hx of poorly controlled diabetes,obesity,paroxysmal atrial fibrillation, hypertension,fatty liver,chronic kidney disease,presenting to the hospital with tachycardia, chest pain, shortness of breath concerning for ACS.    Assessment & Plan    1.  NSTEMI Continue IV heparin Planned for cath tomorrow.   Discussed the cath with the patient. The patient understands that risks included but are not limited to stroke (1 in 1000), death (1 in 1000), kidney failure [usually temporary] (1 in 500), bleeding (1 in 200), allergic reaction [possibly serious] (1 in 200). The patient  understands and agrees to proceed.   2. CRI Stable No change required today Will gently hydrate overnight  3. DM Metformin on hold for cath Primary team to follow  Hillis Range MD, [facility] 04/20/2018 2:28 PM

## 2018-04-20 NOTE — Consult Note (Signed)
ANTICOAGULATION CONSULT NOTE  Pharmacy Consult for heparin management Indication: chest pain/ACS  Patient Measurements: Height: 5\' 9"  (175.3 cm) Weight: 248 lb 10.9 oz (112.8 kg) IBW/kg (Calculated) : 70.7 Heparin Dosing Weight: 95.7kg  Vital Signs: Temp: 98.4 F (36.9 C) (11/10 0904) Temp Source: Oral (11/10 0904) BP: 134/87 (11/10 0904) Pulse Rate: 79 (11/10 0547)  Labs: Recent Labs    04/18/18 1308 04/18/18 1749 04/18/18 1925 04/19/18 0014  04/19/18 0552  04/19/18 1621 04/20/18 0516 04/20/18 1314  HGB 15.3  --   --   --   --  13.8  --   --  14.1  --   HCT 43.0  --   --   --   --  38.8*  --   --  40.1  --   PLT 122*  --   --   --   --  115*  --   --  119*  --   APTT  --   --  28  --   --   --   --   --   --   --   LABPROT  --   --  13.0  --   --   --   --   --   --   --   INR  --   --  0.99  --   --   --   --   --   --   --   HEPARINUNFRC  --   --   --   --    < >  --    < > 0.40 0.28* 0.51  CREATININE 1.35*  --   --   --   --   --   --   --   --   --   TROPONINI 0.03* 0.48*  --  0.48*  --   --   --   --   --   --    < > = values in this interval not displayed.    Estimated Creatinine Clearance: 72.9 mL/min (A) (by C-G formula based on SCr of 1.35 mg/dL (H)).   Medical History: Past Medical History:  Diagnosis Date  . Atrial fibrillation (HCC)   . CKD (chronic kidney disease)   . Diabetes (HCC)   . Hypertension   . NAFL (nonalcoholic fatty liver)     Assessment: 57 y.o. male with a hx of poorly controlled diabetes, obesity, PAF, HTN, fatty liver, CKD, presented to the hospital with tachycardia, chest pain, shortness of breath.  Initial troponin minimally elevated, repeat troponin was 0.48 then repeated at 0.48. He was on no anticoagulants PTA. Hgb is stable, will continue to follow.  Heparin Course: 11/8 PM: 4000 unit bolus, then 1250 units/hr 11/9 0330 HL 0.27: bolus 1300 units, then increase to 1400 units/hr 11/9 1008 HL 0.46 11/9 1621 HL 0.40 11/10  0500 HL 0.28 bolus 1000 units, then increase to 1550 units/hr 11/10 1300 HL 0.51 11/10 1908 HL 0.38  Goal of Therapy:  Heparin level 0.3-0.7 units/ml Monitor platelets by anticoagulation protocol: Yes   Plan:  Heparin level therapeutic. Will continue drip at same rate and recheck in the morning following 2nd consecutive therapeutic level. CBC in am  Burnis Medin, PharmD Clinical Pharmacist 04/20/2018

## 2018-04-20 NOTE — Plan of Care (Signed)
  Problem: Clinical Measurements: Goal: Diagnostic test results will improve Outcome: Progressing Goal: Cardiovascular complication will be avoided Outcome: Progressing   Problem: Pain Managment: Goal: General experience of comfort will improve Outcome: Progressing   Problem: Activity: Goal: Ability to tolerate increased activity will improve Outcome: Progressing   Problem: Cardiac: Goal: Ability to achieve and maintain adequate cardiovascular perfusion will improve Outcome: Progressing

## 2018-04-20 NOTE — Plan of Care (Signed)
  Rounded with MD, pt resting in bed comfortably, patient denies CP or SOB.   Heparin gtt infusing at 15.5 ml/hr. Ambulated around nurses nurses station and tolerated well.   Problem: Education: Goal: Knowledge of General Education information will improve Description Including pain rating scale, medication(s)/side effects and non-pharmacologic comfort measures Outcome: Progressing   Problem: Activity: Goal: Risk for activity intolerance will decrease Outcome: Progressing   Problem: Education: Goal: Understanding of cardiac disease, CV risk reduction, and recovery process will improve Outcome: Progressing   Problem: Education: Goal: Understanding of CV disease, CV risk reduction, and recovery process will improve Outcome: Progressing

## 2018-04-20 NOTE — Consult Note (Signed)
ANTICOAGULATION CONSULT NOTE  Pharmacy Consult for heparin management Indication: chest pain/ACS  Patient Measurements: Height: 5\' 9"  (175.3 cm) Weight: 248 lb 10.9 oz (112.8 kg) IBW/kg (Calculated) : 70.7 Heparin Dosing Weight: 95.7kg  Vital Signs: Temp: 98.4 F (36.9 C) (11/10 0904) Temp Source: Oral (11/10 0904) BP: 134/87 (11/10 0904) Pulse Rate: 79 (11/10 0547)  Labs: Recent Labs    04/18/18 1308 04/18/18 1749 04/18/18 1925 04/19/18 0014  04/19/18 0552  04/19/18 1621 04/20/18 0516 04/20/18 1314  HGB 15.3  --   --   --   --  13.8  --   --  14.1  --   HCT 43.0  --   --   --   --  38.8*  --   --  40.1  --   PLT 122*  --   --   --   --  115*  --   --  119*  --   APTT  --   --  28  --   --   --   --   --   --   --   LABPROT  --   --  13.0  --   --   --   --   --   --   --   INR  --   --  0.99  --   --   --   --   --   --   --   HEPARINUNFRC  --   --   --   --    < >  --    < > 0.40 0.28* 0.51  CREATININE 1.35*  --   --   --   --   --   --   --   --   --   TROPONINI 0.03* 0.48*  --  0.48*  --   --   --   --   --   --    < > = values in this interval not displayed.    Estimated Creatinine Clearance: 72.9 mL/min (A) (by C-G formula based on SCr of 1.35 mg/dL (H)).   Medical History: Past Medical History:  Diagnosis Date  . Atrial fibrillation (HCC)   . CKD (chronic kidney disease)   . Diabetes (HCC)   . Hypertension   . NAFL (nonalcoholic fatty liver)     Assessment: 57 y.o. male with a hx of poorly controlled diabetes, obesity, PAF, HTN, fatty liver, CKD, presented to the hospital with tachycardia, chest pain, shortness of breath.  Initial troponin minimally elevated, repeat troponin was 0.48. He was on no anticoagulants PTA. Hgb has dropped slightly, will continue to follow.  Heparin Course: 11/8 PM: 4000 unit bolus, then 1250 units/hr 11/9 0330 HL 0.27: bolus 1300 units, then increase to 1400 units/hr 11/9 1008 HL 0.46 11/9 1621 HL 0.40 11/10 0500  HL  0.28 11/10 1300 HL 0.51  Goal of Therapy:  Heparin level 0.3-0.7 units/ml Monitor platelets by anticoagulation protocol: Yes   Plan:  Heparin level therapeutic. Will continue drip at same rate and recheck in 6 hour  Yunis Voorheis D Heidee Audi, Pharm.D, BCPS Clinical Pharmacist 04/20/2018

## 2018-04-20 NOTE — Progress Notes (Signed)
Patient ID: Joshua Ewing, male   DOB: [birth year], 57 y.o.   MRN: [id number]  [employer] PROGRESS NOTE  Joshua Ewing WUJ:[id number] DOB: 24-Sep-1960 DOA: 04/18/2018 PCP: Marval Regal, DO  HPI/Subjective: Patient feels well right now.  No complaints of chest pain or shortness of breath.  States he walked around the hallway without any symptoms.  Objective: Vitals:   04/20/18 0547 04/20/18 0904  BP: 126/82 134/87  Pulse: 79   Resp: 17 18  Temp: 97.6 F (36.4 C) 98.4 F (36.9 C)  SpO2: 92% 96%    Filed Weights   04/18/18 1313  Weight: 112.8 kg    ROS: Review of Systems  Constitutional: Negative for chills and fever.  Eyes: Negative for blurred vision.  Respiratory: Negative for cough and shortness of breath.   Cardiovascular: Negative for chest pain.  Gastrointestinal: Negative for abdominal pain, constipation, diarrhea, nausea and vomiting.  Genitourinary: Negative for dysuria.  Musculoskeletal: Negative for joint pain.  Neurological: Negative for dizziness and headaches.   Exam: Physical Exam  Constitutional: He is oriented to person, place, and time.  HENT:  Nose: No mucosal edema.  Mouth/Throat: No oropharyngeal exudate or posterior oropharyngeal edema.  Eyes: Pupils are equal, round, and reactive to light. Conjunctivae, EOM and lids are normal.  Neck: No JVD present. Carotid bruit is not present. No edema present. No thyroid mass and no thyromegaly present.  Cardiovascular: S1 normal and S2 normal. Exam reveals no gallop.  No murmur heard. Pulses:      Dorsalis pedis pulses are 2+ on the right side, and 2+ on the left side.  Respiratory: No respiratory distress. He has no wheezes. He has no rhonchi. He has no rales.  GI: Soft. Bowel sounds are normal. There is no tenderness.  Musculoskeletal:       Right ankle: He exhibits swelling.       Left ankle: He exhibits swelling.  Lymphadenopathy:    He has no cervical adenopathy.  Neurological: He is alert  and oriented to person, place, and time. No cranial nerve deficit.  Skin: Skin is warm. No rash noted. Nails show no clubbing.  Psychiatric: He has a normal mood and affect.      Data Reviewed: Basic Metabolic Panel: Recent Labs  Lab 04/18/18 1308  NA 137  K 3.7  CL 97*  CO2 31  GLUCOSE 331*  BUN 15  CREATININE 1.35*  CALCIUM 9.6   CBC: Recent Labs  Lab 04/18/18 1308 04/19/18 0552 04/20/18 0516  WBC 6.3 7.9 7.9  HGB 15.3 13.8 14.1  HCT 43.0 38.8* 40.1  MCV 87.9 88.0 89.5  PLT 122* 115* 119*   Cardiac Enzymes: Recent Labs  Lab 04/18/18 1308 04/18/18 1749 04/19/18 0014  TROPONINI 0.03* 0.48* 0.48*    CBG: Recent Labs  Lab 04/19/18 1157 04/19/18 1648 04/19/18 2046 04/20/18 0757 04/20/18 1145  GLUCAP 259* 252* 225* 205* 261*     Studies: No results found.  Scheduled Meds: . aspirin  81 mg Oral Daily  . atorvastatin  40 mg Oral q1800  . Influenza vac split quadrivalent PF  0.5 mL Intramuscular Tomorrow-1000  . insulin aspart  0-15 Units Subcutaneous TID WC  . insulin aspart  0-5 Units Subcutaneous QHS  . insulin glargine  8 Units Subcutaneous QHS  . losartan  100 mg Oral Daily  . metoprolol tartrate  50 mg Oral BID  . sodium chloride flush  3 mL Intravenous Q12H   Continuous Infusions: . heparin 1,550 Units/hr (04/20/18  1610)    Assessment/Plan:  1. NSTEMI.  Patient on aspirin, atorvastatin metoprolol and heparin drip.  Echocardiogram ordered but still pending.  Cardiac catheterization likely on Monday. 2. Type 2 diabetes mellitus.  Glucophage on hold.  Hemoglobin A1c 8.9.  Will need better control of diabetes. On insulin sliding scale currently.  Add low-dose Lantus at night.  3. Hypertension on losartan and metoprolol 4. Hyperlipidemia unspecified.  LDL at goal on atorvastatin now.  Triglycerides elevated at 337. 5. Thrombocytopenia.  Check a hepatitis C. 6. Chronic kidney disease stage III.  Check another creatinine tomorrow  morning.  Code Status:     Code Status Orders  (From admission, onward)         Start     Ordered   04/18/18 1538  Full code  Continuous     04/18/18 1539        Code Status History    This patient has a current code status but no historical code status.     Disposition Plan: Potential discharge Monday afternoon if cath negative potential discharge Tuesday if a stent placed on Monday.  Consultants:  Cardiology  Time spent: 27 minutes  Serin Thornell [employer]

## 2018-04-20 NOTE — Consult Note (Signed)
ANTICOAGULATION CONSULT NOTE  Pharmacy Consult for heparin management Indication: chest pain/ACS  Patient Measurements: Height: 5\' 9"  (175.3 cm) Weight: 248 lb 10.9 oz (112.8 kg) IBW/kg (Calculated) : 70.7 Heparin Dosing Weight: 95.7kg  Vital Signs: Temp: 97.6 F (36.4 C) (11/10 0547) Temp Source: Oral (11/10 0547) BP: 126/82 (11/10 0547) Pulse Rate: 79 (11/10 0547)  Labs: Recent Labs    04/18/18 1308 04/18/18 1749 04/18/18 1925 04/19/18 0014  04/19/18 0552 04/19/18 1008 04/19/18 1621 04/20/18 0516  HGB 15.3  --   --   --   --  13.8  --   --  14.1  HCT 43.0  --   --   --   --  38.8*  --   --  40.1  PLT 122*  --   --   --   --  115*  --   --  119*  APTT  --   --  28  --   --   --   --   --   --   LABPROT  --   --  13.0  --   --   --   --   --   --   INR  --   --  0.99  --   --   --   --   --   --   HEPARINUNFRC  --   --   --   --    < >  --  0.46 0.40 0.28*  CREATININE 1.35*  --   --   --   --   --   --   --   --   TROPONINI 0.03* 0.48*  --  0.48*  --   --   --   --   --    < > = values in this interval not displayed.    Estimated Creatinine Clearance: 72.9 mL/min (A) (by C-G formula based on SCr of 1.35 mg/dL (H)).   Medical History: Past Medical History:  Diagnosis Date  . Atrial fibrillation (HCC)   . CKD (chronic kidney disease)   . Diabetes (HCC)   . Hypertension   . NAFL (nonalcoholic fatty liver)     Assessment: 57 y.o. male with a hx of poorly controlled diabetes, obesity, PAF, HTN, fatty liver, CKD, presented to the hospital with tachycardia, chest pain, shortness of breath.  Initial troponin minimally elevated, repeat troponin was 0.48. He was on no anticoagulants PTA. Hgb has dropped slightly, will continue to follow.  Heparin Course: 11/8 PM: 4000 unit bolus, then 1250 units/hr 11/9 0330 HL 0.27: bolus 1300 units, then increase to 1400 units/hr 11/9 1008 HL 0.46 11/9 1621 HL 0.40  Goal of Therapy:  Heparin level 0.3-0.7 units/ml Monitor  platelets by anticoagulation protocol: Yes   Plan:  11/10 @ 0500 HL 0.28 subtherapeutic. Will rebolus w/ heparin 1000 units IV x 1 and increase rate to 1550 units/hr and will recheck HL @ 1300. Hgb stable will continue to monitor.  Thomasene Ripple, PharmD, BCPS Clinical Pharmacist 04/20/2018

## 2018-04-20 NOTE — Progress Notes (Signed)
 Progress Note   Subjective   Doing well today, the patient denies CP or SOB.  No new concerns  Inpatient Medications    Scheduled Meds: . aspirin  81 mg Oral Daily  . atorvastatin  40 mg Oral q1800  . Influenza vac split quadrivalent PF  0.5 mL Intramuscular Tomorrow-1000  . insulin aspart  0-15 Units Subcutaneous TID WC  . insulin aspart  0-5 Units Subcutaneous QHS  . insulin glargine  8 Units Subcutaneous QHS  . losartan  100 mg Oral Daily  . metoprolol tartrate  50 mg Oral BID  . sodium chloride flush  3 mL Intravenous Q12H   Continuous Infusions: . heparin 1,550 Units/hr (04/20/18 0737)   PRN Meds: acetaminophen **OR** acetaminophen, albuterol, nitroGLYCERIN, ondansetron **OR** ondansetron (ZOFRAN) IV, polyethylene glycol   Vital Signs    Vitals:   04/19/18 1647 04/19/18 2032 04/20/18 0547 04/20/18 0904  BP: (!) 137/93 117/79 126/82 134/87  Pulse: 92 88 79   Resp: 14 17 17 18  Temp:  98.4 F (36.9 C) 97.6 F (36.4 C) 98.4 F (36.9 C)  TempSrc:  Oral Oral Oral  SpO2: 94% 94% 92% 96%  Weight:      Height:        Intake/Output Summary (Last 24 hours) at 04/20/2018 1428 Last data filed at 04/20/2018 1404 Gross per 24 hour  Intake 844.06 ml  Output 0 ml  Net 844.06 ml   Filed Weights   04/18/18 1313  Weight: 112.8 kg    Telemetry    sinus - Personally Reviewed  Physical Exam   GEN- The patient is well appearing, alert and oriented x 3 today.   Head- normocephalic, atraumatic Eyes-  Sclera clear, conjunctiva pink Ears- hearing intact Oropharynx- clear Neck- supple, Lungs- Clear to ausculation bilaterally, normal work of breathing Heart- Regular rate and rhythm  GI- soft, NT, ND, + BS Extremities- no clubbing, cyanosis, or edema  MS- no significant deformity or atrophy Skin- no rash or lesion Psych- euthymic mood, full affect Neuro- strength and sensation are intact   Labs    Chemistry Recent Labs  Lab 04/18/18 1308  NA 137  K 3.7    CL 97*  CO2 31  GLUCOSE 331*  BUN 15  CREATININE 1.35*  CALCIUM 9.6  GFRNONAA 56*  GFRAA >60  ANIONGAP 9     Hematology Recent Labs  Lab 04/18/18 1308 04/19/18 0552 04/20/18 0516  WBC 6.3 7.9 7.9  RBC 4.89 4.41 4.48  HGB 15.3 13.8 14.1  HCT 43.0 38.8* 40.1  MCV 87.9 88.0 89.5  MCH 31.3 31.3 31.5  MCHC 35.6 35.6 35.2  RDW 13.4 13.6 13.4  PLT 122* 115* 119*    Cardiac Enzymes Recent Labs  Lab 04/18/18 1308 04/18/18 1749 04/19/18 0014  TROPONINI 0.03* 0.48* 0.48*   No results for input(s): TROPIPOC in the last 168 hours.       Patient Profile:   Joshua Ewingis a 57 y.o.malewith a hx of poorly controlled diabetes,obesity,paroxysmal atrial fibrillation, hypertension,fatty liver,chronic kidney disease,presenting to the hospital with tachycardia, chest pain, shortness of breath concerning for ACS.    Assessment & Plan    1.  NSTEMI Continue IV heparin Planned for cath tomorrow.   Discussed the cath with the patient. The patient understands that risks included but are not limited to stroke (1 in 1000), death (1 in 1000), kidney failure [usually temporary] (1 in 500), bleeding (1 in 200), allergic reaction [possibly serious] (1 in 200). The patient   understands and agrees to proceed.   2. CRI Stable No change required today Will gently hydrate overnight  3. DM Metformin on hold for cath Primary team to follow  Corynne Scibilia MD, FACC 04/20/2018 2:28 PM  

## 2018-04-21 ENCOUNTER — Inpatient Hospital Stay: Payer: Self-pay

## 2018-04-21 ENCOUNTER — Encounter
Admission: EM | Disposition: A | Discharge status: Home / Self Care | Payer: Self-pay | Source: Home / Self Care | Attending: Internal Medicine

## 2018-04-21 DIAGNOSIS — I2510 Atherosclerotic heart disease of native coronary artery without angina pectoris: Secondary | ICD-10-CM

## 2018-04-21 DIAGNOSIS — I214 Non-ST elevation (NSTEMI) myocardial infarction: Secondary | ICD-10-CM

## 2018-04-21 HISTORY — PX: LEFT HEART CATH AND CORONARY ANGIOGRAPHY: CATH118249

## 2018-04-21 LAB — BASIC METABOLIC PANEL
ANION GAP: 7 (ref 5–15)
BUN: 21 mg/dL — ABNORMAL HIGH (ref 6–20)
CO2: 30 mmol/L (ref 22–32)
Calcium: 9.4 mg/dL (ref 8.9–10.3)
Chloride: 103 mmol/L (ref 98–111)
Creatinine, Ser: 1.29 mg/dL — ABNORMAL HIGH (ref 0.61–1.24)
GFR, EST NON AFRICAN AMERICAN: 59 mL/min — AB (ref >60)
Glucose, Bld: 200 mg/dL — ABNORMAL HIGH (ref 70–99)
Potassium: 4.7 mmol/L (ref 3.5–5.1)
Sodium: 140 mmol/L (ref 135–145)

## 2018-04-21 LAB — CBC
HCT: 41.1 % (ref 39.0–52.0)
Hemoglobin: 14.1 g/dL (ref 13.0–17.0)
MCH: 31.1 pg (ref 26.0–34.0)
MCHC: 34.3 g/dL (ref 30.0–36.0)
MCV: 90.5 fL (ref 80.0–100.0)
NRBC: 0 % (ref 0.0–0.2)
Platelets: 121 10*3/uL — ABNORMAL LOW (ref 150–400)
RBC: 4.54 MIL/uL (ref 4.22–5.81)
RDW: 13.4 % (ref 11.5–15.5)
WBC: 7.5 10*3/uL (ref 4.0–10.5)

## 2018-04-21 LAB — GLUCOSE, CAPILLARY
Glucose-Capillary: 182 mg/dL — ABNORMAL HIGH (ref 70–99)
Glucose-Capillary: 239 mg/dL — ABNORMAL HIGH (ref 70–99)
Glucose-Capillary: 202 mg/dL — ABNORMAL HIGH (ref 70–99)
Glucose-Capillary: 214 mg/dL — ABNORMAL HIGH (ref 70–99)

## 2018-04-21 LAB — FIBRIN DERIVATIVES D-DIMER (ARMC ONLY): Fibrin derivatives D-dimer (ARMC): 1427.06 ng/mL (FEU) — ABNORMAL HIGH (ref 0.00–499.00)

## 2018-04-21 LAB — HIV ANTIBODY (ROUTINE TESTING W REFLEX): HIV SCREEN 4TH GENERATION: NONREACTIVE

## 2018-04-21 LAB — HEPARIN LEVEL (UNFRACTIONATED): HEPARIN UNFRACTIONATED: 0.4 [IU]/mL (ref 0.30–0.70)

## 2018-04-21 SURGERY — LEFT HEART CATH AND CORONARY ANGIOGRAPHY
Anesthesia: Moderate Sedation

## 2018-04-21 MED ORDER — LIVING WELL WITH DIABETES BOOK
Freq: Once | Status: DC
  Filled 2018-04-21 (×2): qty 1

## 2018-04-21 MED ORDER — PEN NEEDLES 31G X 5 MM MISC: 1.0000 | Freq: Every day | 0 refills | Status: AC

## 2018-04-21 MED ORDER — MIDAZOLAM HCL 2 MG/2ML IJ SOLN
INTRAMUSCULAR | Status: DC | PRN
  Administered 2018-04-21 (×2): 1 mg via INTRAVENOUS

## 2018-04-21 MED ORDER — FENTANYL CITRATE (PF) 100 MCG/2ML IJ SOLN
INTRAMUSCULAR | Status: AC
  Filled 2018-04-21: qty 2

## 2018-04-21 MED ORDER — VERAPAMIL HCL 2.5 MG/ML IV SOLN
INTRAVENOUS | Status: AC
  Filled 2018-04-21: qty 2

## 2018-04-21 MED ORDER — PEN NEEDLES 31G X 5 MM MISC: 1.0000 | Freq: Every day | 0 refills | Status: DC

## 2018-04-21 MED ORDER — VERAPAMIL HCL 2.5 MG/ML IV SOLN
INTRAVENOUS | Status: DC | PRN
  Administered 2018-04-21: 2.5 mg via INTRA_ARTERIAL

## 2018-04-21 MED ORDER — TECHNETIUM TO 99M ALBUMIN AGGREGATED
4.4800 | Freq: Once | INTRAVENOUS | Status: AC | PRN
  Administered 2018-04-21: 4.48 via INTRAVENOUS

## 2018-04-21 MED ORDER — LOSARTAN POTASSIUM 100 MG PO TABS: 100.0000 mg | ORAL_TABLET | Freq: Every day | ORAL | 0 refills | Status: AC

## 2018-04-21 MED ORDER — SODIUM CHLORIDE 0.9% FLUSH
3.0000 mL | Freq: Two times a day (BID) | INTRAVENOUS | Status: DC
  Administered 2018-04-21: 3 mL via INTRAVENOUS

## 2018-04-21 MED ORDER — SODIUM CHLORIDE 0.9 % IV SOLN
INTRAVENOUS | Status: DC
  Administered 2018-04-21 – 2018-04-22 (×2): via INTRAVENOUS

## 2018-04-21 MED ORDER — IOPAMIDOL (ISOVUE-300) INJECTION 61%
INTRAVENOUS | Status: DC | PRN
  Administered 2018-04-21: 45 mL via INTRA_ARTERIAL

## 2018-04-21 MED ORDER — BLOOD GLUCOSE MONITOR KIT: PACK | 0 refills | Status: AC

## 2018-04-21 MED ORDER — METOPROLOL TARTRATE 50 MG PO TABS: 50.0000 mg | ORAL_TABLET | Freq: Two times a day (BID) | ORAL | 0 refills | Status: AC

## 2018-04-21 MED ORDER — SODIUM CHLORIDE 0.9% FLUSH: 3.0000 mL | INTRAVENOUS | Status: DC | PRN

## 2018-04-21 MED ORDER — SODIUM CHLORIDE 0.9% FLUSH: 3.0000 mL | Freq: Two times a day (BID) | INTRAVENOUS | Status: DC

## 2018-04-21 MED ORDER — ENOXAPARIN SODIUM 120 MG/0.8ML ~~LOC~~ SOLN
1.0000 mg/kg | Freq: Two times a day (BID) | SUBCUTANEOUS | Status: DC
  Administered 2018-04-21 – 2018-04-22 (×2): 110 mg via SUBCUTANEOUS
  Filled 2018-04-21 (×3): qty 0.8

## 2018-04-21 MED ORDER — FENTANYL CITRATE (PF) 100 MCG/2ML IJ SOLN
INTRAMUSCULAR | Status: DC | PRN
  Administered 2018-04-21 (×2): 25 ug via INTRAVENOUS

## 2018-04-21 MED ORDER — METFORMIN HCL ER 500 MG PO TB24: 1000.0000 mg | ORAL_TABLET | Freq: Two times a day (BID) | ORAL | 0 refills | Status: AC

## 2018-04-21 MED ORDER — HEPARIN SODIUM (PORCINE) 1000 UNIT/ML IJ SOLN
INTRAMUSCULAR | Status: AC
  Filled 2018-04-21: qty 1

## 2018-04-21 MED ORDER — ATORVASTATIN CALCIUM 40 MG PO TABS: 40.0000 mg | ORAL_TABLET | Freq: Every day | ORAL | 0 refills | Status: AC

## 2018-04-21 MED ORDER — TECHNETIUM TC 99M DIETHYLENETRIAME-PENTAACETIC ACID
29.6800 | Freq: Once | INTRAVENOUS | Status: AC | PRN
  Administered 2018-04-21: 29.68 via RESPIRATORY_TRACT

## 2018-04-21 MED ORDER — MIDAZOLAM HCL 2 MG/2ML IJ SOLN
INTRAMUSCULAR | Status: AC
  Filled 2018-04-21: qty 2

## 2018-04-21 MED ORDER — INSULIN GLARGINE 100 UNIT/ML ~~LOC~~ SOLN
12.0000 [IU] | Freq: Every day | SUBCUTANEOUS | Status: DC
  Administered 2018-04-21: 12 [IU] via SUBCUTANEOUS
  Filled 2018-04-21 (×2): qty 0.12

## 2018-04-21 MED ORDER — INSULIN GLARGINE 100 UNIT/ML ~~LOC~~ SOLN
15.0000 [IU] | Freq: Every day | SUBCUTANEOUS | Status: DC
  Filled 2018-04-21: qty 0.15

## 2018-04-21 MED ORDER — HEPARIN SODIUM (PORCINE) 1000 UNIT/ML IJ SOLN
INTRAMUSCULAR | Status: DC | PRN
  Administered 2018-04-21: 5500 [IU] via INTRAVENOUS

## 2018-04-21 MED ORDER — SODIUM CHLORIDE 0.9 % IV SOLN
INTRAVENOUS | Status: DC
  Administered 2018-04-21: 08:00:00 via INTRAVENOUS

## 2018-04-21 MED ORDER — SODIUM CHLORIDE 0.9 % IV SOLN: 250.0000 mL | INTRAVENOUS | Status: DC | PRN

## 2018-04-21 MED ORDER — SODIUM CHLORIDE 0.9 % IV SOLN: INTRAVENOUS | Status: AC

## 2018-04-21 MED ORDER — INSULIN GLARGINE 100 UNIT/ML SOLOSTAR PEN: 12.0000 [IU] | PEN_INJECTOR | Freq: Every day | SUBCUTANEOUS | 0 refills | Status: AC

## 2018-04-21 MED ORDER — INSULIN STARTER KIT- PEN NEEDLES (ENGLISH)
1.0000 | Freq: Once | Status: DC
  Filled 2018-04-21: qty 1

## 2018-04-21 MED ORDER — INSULIN GLARGINE 100 UNIT/ML SOLOSTAR PEN: 12.0000 [IU] | PEN_INJECTOR | Freq: Every day | SUBCUTANEOUS | 0 refills | Status: DC

## 2018-04-21 MED ORDER — HEPARIN (PORCINE) IN NACL 1000-0.9 UT/500ML-% IV SOLN
INTRAVENOUS | Status: AC
  Filled 2018-04-21: qty 1000

## 2018-04-21 SURGICAL SUPPLY — 6 items
CATH INFINITI 5FR JK (CATHETERS) ×3 IMPLANT
DEVICE RAD COMP TR BAND LRG (VASCULAR PRODUCTS) ×3 IMPLANT
GLIDESHEATH SLEND SS 6F .021 (SHEATH) ×3 IMPLANT
KIT MANI 3VAL PERCEP (MISCELLANEOUS) ×3 IMPLANT
PACK CARDIAC CATH (CUSTOM PROCEDURE TRAY) ×3 IMPLANT
WIRE ROSEN-J .035X260CM (WIRE) ×3 IMPLANT

## 2018-04-21 NOTE — Progress Notes (Addendum)
Pt ambulated around the nurses station x1 and tolerated well, no sob, no cp. VSS. Will continue to monitor.

## 2018-04-21 NOTE — Care Management Note (Signed)
Case Management Note  Patient Details  Name: Joshua Ewing MRN: [id number] Date of Birth: [birth year]  Subjective/Objective: Patient is independent from home with chest pain and mildly elevated troponin's.  Currently in getting cardiac catheterization.  He is here with wife from [employer] [facility] [employer] for job training.  He is currently uninsured but should have insurance mid December with his new employer.  He is current with [facility] practice.  He takes Metformin , ASA and cholesterol medication per his wife.  Spoke with [facility] and they will fill 1 month supply of Lantus pen and needles.  Dr. Renae Gloss printed out prescription for Lantus and pen needles; RNCM faxed to [facility].  Supplied patient with Reli-on glucometer and strips/lancets.  Wife states she knows how to use meter.  Will continue to follow as patient progresses.                   Action/Plan:   Expected Discharge Date:                  Expected Discharge Plan:  Home/Self Care  In-House Referral:     Discharge planning Services  CM Consult, Medication Assistance  Post Acute Care Choice:    Choice offered to:     DME Arranged:    DME Agency:     HH Arranged:    HH Agency:     Status of Service:  Completed, signed off  If discussed at [employer] of Stay Meetings, dates discussed:    Additional Comments:  Sherren Kerns, RN 04/21/2018, 3:24 PM

## 2018-04-21 NOTE — Plan of Care (Signed)
  Problem: Education: Goal: Knowledge of General Education information will improve Description Including pain rating scale, medication(s)/side effects and non-pharmacologic comfort measures Outcome: Progressing   Problem: Clinical Measurements: Goal: Cardiovascular complication will be avoided Outcome: Progressing   Problem: Activity: Goal: Risk for activity intolerance will decrease Outcome: Progressing   Problem: Education: Goal: Ability to describe self-care measures that may prevent or decrease complications (Diabetes Survival Skills Education) will improve Outcome: Progressing   Problem: Coping: Goal: Ability to adjust to condition or change in health will improve Outcome: Progressing   Problem: Nutritional: Goal: Maintenance of adequate nutrition will improve Outcome: Progressing

## 2018-04-21 NOTE — Consult Note (Signed)
ANTICOAGULATION CONSULT NOTE  Pharmacy Consult for heparin management Indication: chest pain/ACS  Patient Measurements: Height: 5\' 9"  (175.3 cm) Weight: 248 lb 10.9 oz (112.8 kg) IBW/kg (Calculated) : 70.7 Heparin Dosing Weight: 95.7kg  Vital Signs: Temp: 98.2 F (36.8 C) (11/10 2035) Temp Source: Oral (11/10 2035) BP: 138/86 (11/10 2035) Pulse Rate: 82 (11/10 2035)  Labs: Recent Labs    04/18/18 1308 04/18/18 1749 04/18/18 1925 04/19/18 0014  04/19/18 0552  04/20/18 0516 04/20/18 1314 04/20/18 1908 04/21/18 0455  HGB 15.3  --   --   --   --  13.8  --  14.1  --   --  14.1  HCT 43.0  --   --   --   --  38.8*  --  40.1  --   --  41.1  PLT 122*  --   --   --   --  115*  --  119*  --   --  121*  APTT  --   --  28  --   --   --   --   --   --   --   --   LABPROT  --   --  13.0  --   --   --   --   --   --   --   --   INR  --   --  0.99  --   --   --   --   --   --   --   --   HEPARINUNFRC  --   --   --   --    < >  --    < > 0.28* 0.51 0.38 0.40  CREATININE 1.35*  --   --   --   --   --   --   --   --   --  1.29*  TROPONINI 0.03* 0.48*  --  0.48*  --   --   --   --   --   --   --    < > = values in this interval not displayed.    Estimated Creatinine Clearance: 76.3 mL/min (A) (by C-G formula based on SCr of 1.29 mg/dL (H)).   Medical History: Past Medical History:  Diagnosis Date  . Atrial fibrillation (HCC)   . CKD (chronic kidney disease)   . Diabetes (HCC)   . Hypertension   . NAFL (nonalcoholic fatty liver)     Assessment: 57 y.o. male with a hx of poorly controlled diabetes, obesity, PAF, HTN, fatty liver, CKD, presented to the hospital with tachycardia, chest pain, shortness of breath.  Initial troponin minimally elevated, repeat troponin was 0.48 then repeated at 0.48. He was on no anticoagulants PTA. Hgb is stable, will continue to follow.  Heparin Course: 11/8 PM: 4000 unit bolus, then 1250 units/hr 11/9 0330 HL 0.27: bolus 1300 units, then increase to  1400 units/hr 11/9 1008 HL 0.46 11/9 1621 HL 0.40 11/10 0500 HL 0.28 bolus 1000 units, then increase to 1550 units/hr 11/10 1300 HL 0.51 11/10 1908 HL 0.38  Goal of Therapy:  Heparin level 0.3-0.7 units/ml Monitor platelets by anticoagulation protocol: Yes   Plan:  11/11 @ 0500 HL 0.40 therapeutic. Will continue current rate and will recheck HL w/ am labs. CBC stable will continue to monitor.  Thomasene Ripple, PharmD, BCPS Clinical Pharmacist 04/21/2018

## 2018-04-21 NOTE — Progress Notes (Signed)
Patient recently got a new job- not sure if insurance has kicked in quite yet Did provide him education on test blood sugars Education on injecting insulin, rotating site, priming pen, injection technique, check BG in AM, goal blood sugar levels. What to do if BG low <80 Rule of 15 education Educated patient on metformin- how to dose titrate to 1000mg  BID, how metformin works and side effect Educated patient about exercise and finding something he enjoys that elevated HR for 30 or more min 5 days a week. Talked about starting small and working up to goal Talked about the benefits on insulin resistance with exercise All questions answered  Olene Floss, Pharm.D, BCPS Clinical Pharmacist

## 2018-04-21 NOTE — Progress Notes (Signed)
Pt returns from cath lab, Right radial artery, site looks clean, dry, intact, no bleeding, no hematoma. Will continue to monitor.

## 2018-04-21 NOTE — Progress Notes (Signed)
ANTICOAGULATION CONSULT NOTE - Initial Consult  Pharmacy Consult for Enoxaparin  Indication: pulmonary embolus  Allergies  Allergen Reactions  . Penicillins Hives    Has patient had a PCN reaction causing immediate rash, facial/tongue/throat swelling, SOB or lightheadedness with hypotension: No Has patient had a PCN reaction causing severe rash involving mucus membranes or skin necrosis: No Has patient had a PCN reaction that required hospitalization: No Has patient had a PCN reaction occurring within the last 10 years: Unknown If all of the above answers are "NO", then may proceed with Cephalosporin use.   . Sulfa Antibiotics Other (See Comments)    Childhood reaction: unknown    Patient Measurements: Height: 5' 9"  (175.3 cm) Weight: 241 lb 11.2 oz (109.6 kg) IBW/kg (Calculated) : 70.7   Vital Signs: Temp: 98.4 F (36.9 C) (11/11 1024) Temp Source: Oral (11/11 1024) BP: 129/91 (11/11 1542) Pulse Rate: 83 (11/11 1542)  Labs: Recent Labs    04/18/18 1749 04/18/18 1925 04/19/18 0014  04/19/18 0552  04/20/18 0516 04/20/18 1314 04/20/18 1908 04/21/18 0455  HGB  --   --   --    < > 13.8  --  14.1  --   --  14.1  HCT  --   --   --   --  38.8*  --  40.1  --   --  41.1  PLT  --   --   --   --  115*  --  119*  --   --  121*  APTT  --  28  --   --   --   --   --   --   --   --   LABPROT  --  13.0  --   --   --   --   --   --   --   --   INR  --  0.99  --   --   --   --   --   --   --   --   HEPARINUNFRC  --   --   --    < >  --    < > 0.28* 0.51 0.38 0.40  CREATININE  --   --   --   --   --   --   --   --   --  1.29*  TROPONINI 0.48*  --  0.48*  --   --   --   --   --   --   --    < > = values in this interval not displayed.    Estimated Creatinine Clearance: 75.3 mL/min (A) (by C-G formula based on SCr of 1.29 mg/dL (H)).   Medical History: Past Medical History:  Diagnosis Date  . Atrial fibrillation ([place])   . CKD (chronic kidney disease)   . Diabetes ([place])   .  Hypertension   . NAFL (nonalcoholic fatty liver)     Medications:  Scheduled:  . aspirin  81 mg Oral Daily  . atorvastatin  40 mg Oral q1800  . enoxaparin (LOVENOX) injection  1 mg/kg Subcutaneous Q12H  . Influenza vac split quadrivalent PF  0.5 mL Intramuscular Tomorrow-1000  . insulin aspart  0-15 Units Subcutaneous TID WC  . insulin aspart  0-5 Units Subcutaneous QHS  . insulin glargine  15 Units Subcutaneous QHS  . insulin starter kit- pen needles  1 kit Other Once  . living well with diabetes book   Does not  apply Once  . losartan  100 mg Oral Daily  . metoprolol tartrate  50 mg Oral BID  . sodium chloride flush  3 mL Intravenous Q12H  . sodium chloride flush  3 mL Intravenous Q12H   Infusions:  . sodium chloride    . sodium chloride 75 mL/hr at 04/21/18 1630   PRN: sodium chloride, acetaminophen **OR** acetaminophen, albuterol, nitroGLYCERIN, ondansetron **OR** ondansetron (ZOFRAN) IV, polyethylene glycol, sodium chloride flush  Assessment: Findings represent an intermediate probability for pulmonary embolism. Hgb and plt stable. No note of bleeding. LHC completed 11/11 for NSTEMI.   Goal of Therapy:  Monitor platelets by anticoagulation protocol: Yes   Plan:  Lovenox 1 mg/kg q12H. Order CBC and creatinine every 3 days.  Oswald Hillock, PharmD  04/21/2018,4:37 PM

## 2018-04-21 NOTE — Progress Notes (Addendum)
Inpatient Diabetes Program Recommendations  AACE/ADA: New Consensus Statement on Inpatient Glycemic Control (2019)  Target Ranges:  Prepandial:   less than 140 mg/dL      Peak postprandial:   less than 180 mg/dL (1-2 hours)      Critically ill patients:  140 - 180 mg/dL   Results for Joshua Ewing, Joshua Ewing (MRN [id number]) as of 04/21/2018 11:51  Ref. Range 04/20/2018 07:57 04/20/2018 11:45 04/20/2018 16:24 04/20/2018 21:07 04/21/2018 07:15 04/21/2018 11:39  Glucose-Capillary Latest Ref Range: 70 - 99 mg/dL 205 (H) 261 (H) 176 (H) 188 (H) 182 (H) 239 (H)  Results for Joshua Ewing, Joshua Ewing (MRN [id number]) as of 04/21/2018 11:51  Ref. Range 04/19/2018 16:21  Hemoglobin A1C Latest Ref Range: 4.8 - 5.6 % 8.9 (H)   Review of Glycemic Control  Diabetes history: DM2 Outpatient Diabetes medications: Metformin XR 1000 mg QPM Current orders for Inpatient glycemic control: Lantus 15 units QHS, Novolog 0-15 units TID with meals, Novolog 0-5 units QHS  Inpatient Diabetes Program Recommendations:  Insulin - Basal: Noted Lantus was increased from 8 units QHS to 15 units QHS today.  HgbA1C: A1C 8.9% on 04/19/18 indicating an average glucose of 209 mg/dl over the past 2-3 months. Per chart, patient has NO insurance listed and patient is unsure if insurance with new job is active or not. Therefore, may need affordable DM medications as an outpatient.   NOTE: In reviewing chart, noted patient is from [place], [place] and per [facility] last seen provider at [facility] on 11/05/17. A1C was 8.2% on 11/05/17. Per office note on 11/05/17 by D. Quentin Cornwall, FNP, patient had stopped all medications and was instructed to restart medications and follow up at the clinic in 6 months.   Addendum 04/21/18_0 :55-Spoke with patient about diabetes and home regimen for diabetes control. Patient reports that he is from [place], [place] and he is here in [facility] for job training and will be here until mid December. Patient reports that he is  not sure if his insurance has started yet (just started current job in September). Asked patient to call and see if he can find out for sure if he has insurance. If patient does not have insurance, he may need to use vial/syringe at home versus insulin pens.  Patient reports that he has never been on insulin in the past and he is not checking glucose at home because he has never been asked to do so.  Patient does not have a glucometer at home and will need one along with testing supplies.  Inquired about prior A1C and patient reports that his A1C has been slowly going up over the past year or so. Patient reports his last A1C in May 2019 was 7.9% (at the [facility]).  Discussed A1C results (8.9% on 04/19/18) and explained that his current A1C indicates an average glucose of 209 mg/dl over the past 2-3 months. Discussed glucose and A1C goals. Discussed importance of checking CBGs and maintaining good CBG control to prevent long-term and short-term complications.  Discussed carbohydrates, carbohydrate goals per day and meal, along with portion sizes. Patient states that he was shown how to use an insulin pen by pharmacist today. Discussed Lantus insulin and how it works. Explained that if he does not currently have insurance, he may need to discharge on vial/syringe instead of insulin pens due to cost (box of Lantus Solostar pens are about $357 with [employer]). Reviewed and re-demonstrated how to use an insulin pen. Also educated on vial/syringe  technique for insulin injections as well. Patient was able to successfully demonstrate both methods.   Encouraged patient to check his glucose as MD directs and to keep a log book of glucose readings and insulin taken which he will need to take to doctor appointments. Patient reports that his last appointment at the [facility] was on 11/05/17 and he is due for a 6 month check up. Patient reports that he will be in the area until mid December but he does  travel back to [place] on his days off.  Encouraged patient to call and make an appointment for follow up. Informed patient that CM would be consulted if not already following.  Informed patient an insulin starter kit and a Living Well with DM book would be ordered for him. Encouraged patient to read over information when received. Patient verbalized understanding of information discussed and he states that he has no further questions at this time related to diabetes. Talked with Geoffry Paradise, RN, Case Manager regarding patient needs. [facility] to see if patient could get discharge medications filled there (despite not being an [facility] resident) but did not get an answer and had to leave a message.  CM notes she will follow up on patient and will see if hosptial can provide a Reli-On glucometer to patient.   Thanks, Barnie Alderman, RN, MSN, CDE Diabetes Coordinator Inpatient Diabetes Program [phone] (Team Pager from 8am to 5pm)

## 2018-04-21 NOTE — Progress Notes (Signed)
Patient ID: Joshua Ewing, male   DOB: 1961/05/25, 57 y.o.   MRN: [id number]   [employer] PROGRESS NOTE  Idriss Quackenbush LKT:[id number] DOB: 04-Apr-1961 DOA: 04/18/2018 PCP: Lovey Newcomer, DO  HPI/Subjective: Patient with a little discomfort in his left chest and difficulty with taking a real deep breath.  Otherwise feels well.  He walks around feeling okay.  Objective: Vitals:   04/21/18 1024 04/21/18 1542  BP: 134/80 (!) 129/91  Pulse: 85 83  Resp: 18 18  Temp: 98.4 F (36.9 C)   SpO2: 95% 95%    Filed Weights   04/18/18 1313 04/21/18 0616  Weight: 112.8 kg 109.6 kg    ROS: Review of Systems  Constitutional: Negative for chills and fever.  Eyes: Negative for blurred vision.  Respiratory: Negative for cough and shortness of breath.   Cardiovascular: Positive for chest pain.  Gastrointestinal: Negative for abdominal pain, constipation, diarrhea, nausea and vomiting.  Genitourinary: Negative for dysuria.  Musculoskeletal: Negative for joint pain.  Neurological: Negative for dizziness and headaches.   Exam: Physical Exam  Constitutional: He is oriented to person, place, and time.  HENT:  Nose: No mucosal edema.  Mouth/Throat: No oropharyngeal exudate or posterior oropharyngeal edema.  Eyes: Pupils are equal, round, and reactive to light. Conjunctivae, EOM and lids are normal.  Neck: No JVD present. Carotid bruit is not present. No edema present. No thyroid mass and no thyromegaly present.  Cardiovascular: S1 normal and S2 normal. Exam reveals no gallop.  No murmur heard. Pulses:      Dorsalis pedis pulses are 2+ on the right side, and 2+ on the left side.  Respiratory: No respiratory distress. He has no wheezes. He has no rhonchi. He has no rales.  GI: Soft. Bowel sounds are normal. There is no tenderness.  Musculoskeletal:       Right ankle: He exhibits swelling.       Left ankle: He exhibits swelling.  Lymphadenopathy:    He has no cervical adenopathy.   Neurological: He is alert and oriented to person, place, and time. No cranial nerve deficit.  Skin: Skin is warm. No rash noted. Nails show no clubbing.  Psychiatric: He has a normal mood and affect.      Data Reviewed: Basic Metabolic Panel: Recent Labs  Lab 04/18/18 1308 04/21/18 0455  NA 137 140  K 3.7 4.7  CL 97* 103  CO2 31 30  GLUCOSE 331* 200*  BUN 15 21*  CREATININE 1.35* 1.29*  CALCIUM 9.6 9.4   CBC: Recent Labs  Lab 04/18/18 1308 04/19/18 0552 04/20/18 0516 04/21/18 0455  WBC 6.3 7.9 7.9 7.5  HGB 15.3 13.8 14.1 14.1  HCT 43.0 38.8* 40.1 41.1  MCV 87.9 88.0 89.5 90.5  PLT 122* 115* 119* 121*   Cardiac Enzymes: Recent Labs  Lab 04/18/18 1308 04/18/18 1749 04/19/18 0014  TROPONINI 0.03* 0.48* 0.48*    CBG: Recent Labs  Lab 04/20/18 1624 04/20/18 2107 04/21/18 0715 04/21/18 1139 04/21/18 1639  GLUCAP 176* 188* 182* 239* 202*     Studies: Nm Pulmonary Perf And Vent  Result Date: 04/21/2018 CLINICAL DATA:  Elevated D-dimer, clinically suspected low probability for pulmonary embolism, history atrial fibrillation, diabetes mellitus, hypertension, chronic kidney disease, nonalcoholic fatty liver disease EXAM: NUCLEAR MEDICINE VENTILATION - PERFUSION LUNG SCAN TECHNIQUE: Ventilation images were obtained in multiple projections using inhaled aerosol Tc-52mDTPA. Perfusion images were obtained in multiple projections after intravenous injection of Tc-988mAA. RADIOPHARMACEUTICALS:  29.68 mCi of Tc-9968mPA  aerosol inhalation and 4.48 mCi Tc-83mMAA IV COMPARISON:  None Correlation: Chest radiograph 04/21/2018 FINDINGS: Ventilation: Diffusely diminished ventilation to the RIGHT upper lobe. Remaining ventilation normal. Perfusion: Diffusely diminished perfusion to the entirety of the RIGHT upper lobe. Mildly diminished perfusion diffusely in LEFT lower lobe. No other for perfusion defects. Chest radiograph: Clear lung fields bilaterally. IMPRESSION:  Diminished ventilation and perfusion in the RIGHT upper lobe diffusely. Additional diminished perfusion in the LEFT lower lobe with normal ventilation. Clear lungs by chest radiograph. Findings represent an intermediate probability for pulmonary embolism; consider follow-up CTA imaging of the chest to exclude pulmonary emboli. Electronically Signed   By: MLavonia DanaM.D.   On: 04/21/2018 15:49   Dg Chest Port 1 View  Result Date: 04/21/2018 CLINICAL DATA:  Positive D-dimer. Concern for pulmonary embolism. Pending nuclear medicine V/Q scan. EXAM: PORTABLE CHEST 1 VIEW COMPARISON:  04/18/2018 FINDINGS: The cardiomediastinal silhouette is within normal limits. The lungs are well inflated and clear. There is no evidence of pleural effusion or pneumothorax. No acute osseous abnormality is identified. IMPRESSION: No active disease. Electronically Signed   By: ALogan BoresM.D.   On: 04/21/2018 13:54    Scheduled Meds: . aspirin  81 mg Oral Daily  . atorvastatin  40 mg Oral q1800  . enoxaparin (LOVENOX) injection  1 mg/kg Subcutaneous Q12H  . Influenza vac split quadrivalent PF  0.5 mL Intramuscular Tomorrow-1000  . insulin aspart  0-15 Units Subcutaneous TID WC  . insulin aspart  0-5 Units Subcutaneous QHS  . insulin glargine  15 Units Subcutaneous QHS  . insulin starter kit- pen needles  1 kit Other Once  . living well with diabetes book   Does not apply Once  . losartan  100 mg Oral Daily  . metoprolol tartrate  50 mg Oral BID  . sodium chloride flush  3 mL Intravenous Q12H  . sodium chloride flush  3 mL Intravenous Q12H   Continuous Infusions: . sodium chloride    . sodium chloride 75 mL/hr at 04/21/18 1630    Assessment/Plan:  1. Elevated troponin with chest pain and shortness of breath.  Cardiac catheterization only showed minimal disease. Patient on aspirin, atorvastatin metoprolol.  Echocardiogram normal ejection fraction.  I ordered a d-dimer which was elevated.  Because the patient  had a cardiac catheterization today I was unable to do a CT scan of the chest.  I did a VQ scan which of course was intermediate probability.  I will hydrate the patient overnight and get a CT scan of the chest tomorrow morning.  We will also get an ultrasound of the lower extremities to rule out DVT. 2. Type 2 diabetes mellitus.  Glucophage on hold.  Hemoglobin A1c 8.9.  Will need better control of diabetes. On insulin sliding scale currently.  Continue low-dose Lantus at night.  3. Hypertension on losartan and metoprolol 4. Hyperlipidemia unspecified.  LDL at goal on atorvastatin now.  Triglycerides elevated at 337. 5. Thrombocytopenia.  hepatitis C test ordered but still pending currently 6. Chronic kidney disease stage III.  Check another creatinine tomorrow morning.  Code Status:     Code Status Orders  (From admission, onward)         Start     Ordered   04/18/18 1538  Full code  Continuous     04/18/18 1539        Code Status History    This patient has a current code status but no historical code status.  Disposition Plan: Get CT scan of the chest tomorrow to rule out pulmonary embolism after hydration today.  Potential discharge tomorrow.  Consultants:  Cardiology  Time spent: 35 minutes  [place]

## 2018-04-21 NOTE — Interval H&P Note (Signed)
Cath Lab Visit (complete for each Cath Lab visit)  Clinical Evaluation Leading to the Procedure:   ACS: Yes.    Non-ACS:  n/a   History and Physical Interval Note:  04/21/2018 7:51 AM  Joshua Ewing  has presented today for surgery, with the diagnosis of NSTEMI  The various methods of treatment have been discussed with the patient and family. After consideration of risks, benefits and other options for treatment, the patient has consented to  Procedure(s): LEFT HEART CATH AND CORONARY ANGIOGRAPHY (N/A) as a surgical intervention .  The patient's history has been reviewed, patient examined, no change in status, stable for surgery.  I have reviewed the patient's chart and labs.  Questions were answered to the patient's satisfaction.     Lorine Bears

## 2018-04-22 ENCOUNTER — Encounter: Payer: Self-pay | Admitting: *Deleted

## 2018-04-22 ENCOUNTER — Inpatient Hospital Stay: Payer: Self-pay

## 2018-04-22 DIAGNOSIS — I2602 Saddle embolus of pulmonary artery with acute cor pulmonale: Secondary | ICD-10-CM

## 2018-04-22 DIAGNOSIS — N183 Chronic kidney disease, stage 3 (moderate): Secondary | ICD-10-CM

## 2018-04-22 DIAGNOSIS — I2510 Atherosclerotic heart disease of native coronary artery without angina pectoris: Secondary | ICD-10-CM

## 2018-04-22 LAB — BASIC METABOLIC PANEL
Anion gap: 7 (ref 5–15)
BUN: 19 mg/dL (ref 6–20)
CHLORIDE: 107 mmol/L (ref 98–111)
CO2: 25 mmol/L (ref 22–32)
Calcium: 9.1 mg/dL (ref 8.9–10.3)
Creatinine, Ser: 1.09 mg/dL (ref 0.61–1.24)
GFR calc Af Amer: >60 mL/min (ref >60)
GFR calc non Af Amer: >60 mL/min (ref >60)
GLUCOSE: 181 mg/dL — AB (ref 70–99)
Potassium: 4 mmol/L (ref 3.5–5.1)
Sodium: 139 mmol/L (ref 135–145)

## 2018-04-22 LAB — HEPATITIS C ANTIBODY: HCV Ab: <0.1 s/co ratio (ref 0.0–0.9)

## 2018-04-22 LAB — GLUCOSE, CAPILLARY: Glucose-Capillary: 148 mg/dL — ABNORMAL HIGH (ref 70–99)

## 2018-04-22 MED ORDER — IOPAMIDOL (ISOVUE-370) INJECTION 76%
75.0000 mL | Freq: Once | INTRAVENOUS | Status: AC | PRN
  Administered 2018-04-22: 75 mL via INTRAVENOUS

## 2018-04-22 MED ORDER — APIXABAN 5 MG PO TABS: ORAL_TABLET | ORAL | 0 refills | Status: AC

## 2018-04-22 NOTE — Care Management (Signed)
30 day free Eliquis coupon given to patient and explained to take to any retail pharmacy with prescription.  Plan to discharge today per Dr. Renae GlossWieting.

## 2018-04-22 NOTE — Progress Notes (Signed)
Progress Note  Patient Name: Joshua Ewing Date of Encounter: 04/22/2018  Primary Cardiologist: New - Consult by Rockey Situ  Subjective   Patient feels well.  No chest pain or shortness of breath.  No pain at right radial arteriotomy site.  Inpatient Medications    Scheduled Meds: . aspirin  81 mg Oral Daily  . atorvastatin  40 mg Oral q1800  . enoxaparin (LOVENOX) injection  1 mg/kg Subcutaneous Q12H  . insulin aspart  0-15 Units Subcutaneous TID WC  . insulin aspart  0-5 Units Subcutaneous QHS  . insulin glargine  12 Units Subcutaneous QHS  . insulin starter kit- pen needles  1 kit Other Once  . living well with diabetes book   Does not apply Once  . losartan  100 mg Oral Daily  . metoprolol tartrate  50 mg Oral BID  . sodium chloride flush  3 mL Intravenous Q12H  . sodium chloride flush  3 mL Intravenous Q12H   Continuous Infusions: . sodium chloride    . sodium chloride 75 mL/hr at 04/22/18 0726   PRN Meds: sodium chloride, acetaminophen **OR** acetaminophen, albuterol, nitroGLYCERIN, ondansetron **OR** ondansetron (ZOFRAN) IV, polyethylene glycol, sodium chloride flush   Vital Signs    Vitals:   04/21/18 1542 04/21/18 2025 04/22/18 0449 04/22/18 0915  BP: (!) 129/91 (!) 126/93 124/78 (!) 141/79  Pulse: 83 85 70 86  Resp: _0 Temp:  98.6 F (37 C) 97.7 F (36.5 C)   TempSrc:  Oral Oral   SpO2: 95% 95% 97% 96%  Weight:      Height:        Intake/Output Summary (Last 24 hours) at 04/22/2018 1154 Last data filed at 04/22/2018 0400 Gross per 24 hour  Intake 1058.68 ml  Output 1100 ml  Net -41.32 ml   Filed Weights   04/18/18 1313 04/21/18 0616  Weight: 112.8 kg 109.6 kg    Telemetry    NSR, sinus bradycardia, and sinus arrhythmia - Personally Reviewed  ECG    No new tracing  Physical Exam   GEN: No acute distress.   Neck: No JVD Cardiac: RRR, no murmurs, rubs, or gallops. Right radial arteriotomy site covered with clean dressing.  No  bruising or hematoma.  2+ right radial pulse. Respiratory: Clear to auscultation bilaterally. Neuro:  Nonfocal  Psych: Normal affect   Labs    Chemistry Recent Labs  Lab 04/18/18 1308 04/21/18 0455 04/22/18 0518  NA 137 140 139  K 3.7 4.7 4.0  CL 97* 103 107  CO2 _1 GLUCOSE 331* 200* 181*  BUN 15 21* 19  CREATININE 1.35* 1.29* 1.09  CALCIUM 9.6 9.4 9.1  GFRNONAA 56* 59* >60  GFRAA >60 >60 >60  ANIONGAP _2 Hematology Recent Labs  Lab 04/19/18 0552 04/20/18 0516 04/21/18 0455  WBC 7.9 7.9 7.5  RBC 4.41 4.48 4.54  HGB 13.8 14.1 14.1  HCT 38.8* 40.1 41.1  MCV 88.0 89.5 90.5  MCH 31.3 31.5 31.1  MCHC 35.6 35.2 34.3  RDW 13.6 13.4 13.4  PLT 115* 119* 121*    Cardiac Enzymes Recent Labs  Lab 04/18/18 1308 04/18/18 1749 04/19/18 0014  TROPONINI 0.03* 0.48* 0.48*   No results for input(s): TROPIPOC in the last 168 hours.   BNPNo results for input(s): BNP, PROBNP in the last 168 hours.   DDimer No results for input(s): DDIMER in the last 168 hours.   Radiology  Ct Angio Chest Pe W Or Wo Contrast  Result Date: 04/22/2018 CLINICAL DATA:  Chest discomfort and difficulty breathing. Indeterminate nuclear medicine scan. EXAM: CT ANGIOGRAPHY CHEST WITH CONTRAST TECHNIQUE: Multidetector CT imaging of the chest was performed using the standard protocol during bolus administration of intravenous contrast. Multiplanar CT image reconstructions and MIPs were obtained to evaluate the vascular anatomy. CONTRAST:  64m ISOVUE-370 IOPAMIDOL (ISOVUE-370) INJECTION 76% COMPARISON:  Nuclear medicine pulmonary scan on 04/21/2018 FINDINGS: Cardiovascular: Significant pulmonary embolism present with an acute saddle thrombus crossing the main pulmonary artery bifurcation and extending into the lower lobes bilaterally with nonocclusive thrombus extending into segmental branches. No component of significant right heart strain by CTA. No pericardial fluid. No significant  coronary artery calcification. Mediastinum/Nodes: No enlarged mediastinal, hilar, or axillary lymph nodes. Thyroid gland, trachea, and esophagus demonstrate no significant findings. Lungs/Pleura: There is no evidence of pulmonary edema, consolidation, pneumothorax, nodule or pleural fluid. No peripheral pulmonary infarcts identified. Upper Abdomen: The liver shows probable steatosis. The spleen may be mild to moderately enlarged. The spleen is incompletely imaged. There is likely a small hiatal hernia. Musculoskeletal: No chest wall abnormality. No acute or significant osseous findings. Review of the MIP images confirms the above findings. IMPRESSION: Acute saddle pulmonary embolism with a saddle thrombus crossing the main pulmonary artery bifurcation and extending into both lower lobes bilaterally. Lower lobe thrombus is nonocclusive bilaterally. No evidence of right heart strain by CTA. These results were called by telephone at the time of interpretation on 04/22/2018 at 09:50 AM to Dr. RLoletha Grayer, who verbally acknowledged these results. Electronically Signed   By: GAletta EdouardM.D.   On: 04/22/2018 10:00   Nm Pulmonary Perf And Vent  Result Date: 04/21/2018 CLINICAL DATA:  Elevated D-dimer, clinically suspected low probability for pulmonary embolism, history atrial fibrillation, diabetes mellitus, hypertension, chronic kidney disease, nonalcoholic fatty liver disease EXAM: NUCLEAR MEDICINE VENTILATION - PERFUSION LUNG SCAN TECHNIQUE: Ventilation images were obtained in multiple projections using inhaled aerosol Tc-937mTPA. Perfusion images were obtained in multiple projections after intravenous injection of Tc-9915mA. RADIOPHARMACEUTICALS:  29.68 mCi of Tc-4m75mA aerosol inhalation and 4.48 mCi Tc-4m 55mIV COMPARISON:  None Correlation: Chest radiograph 04/21/2018 FINDINGS: Ventilation: Diffusely diminished ventilation to the RIGHT upper lobe. Remaining ventilation normal. Perfusion:  Diffusely diminished perfusion to the entirety of the RIGHT upper lobe. Mildly diminished perfusion diffusely in LEFT lower lobe. No other for perfusion defects. Chest radiograph: Clear lung fields bilaterally. IMPRESSION: Diminished ventilation and perfusion in the RIGHT upper lobe diffusely. Additional diminished perfusion in the LEFT lower lobe with normal ventilation. Clear lungs by chest radiograph. Findings represent an intermediate probability for pulmonary embolism; consider follow-up CTA imaging of the chest to exclude pulmonary emboli. Electronically Signed   By: Mark Lavonia Dana   On: 04/21/2018 15:49   Us VeKoreaus Img Lower Bilateral  Result Date: 04/21/2018 CLINICAL DATA:  57 ye42 old male with a history of swelling EXAM: BILATERAL LOWER EXTREMITY VENOUS DOPPLER ULTRASOUND TECHNIQUE: Gray-scale sonography with graded compression, as well as color Doppler and duplex ultrasound were performed to evaluate the lower extremity deep venous systems from the level of the common femoral vein and including the common femoral, femoral, profunda femoral, popliteal and calf veins including the posterior tibial, peroneal and gastrocnemius veins when visible. The superficial great saphenous vein was also interrogated. Spectral Doppler was utilized to evaluate flow at rest and with distal augmentation maneuvers in the common femoral, femoral and popliteal veins. COMPARISON:  None. FINDINGS:  RIGHT LOWER EXTREMITY Common Femoral Vein: No evidence of thrombus. Normal compressibility, respiratory phasicity and response to augmentation. Saphenofemoral Junction: No evidence of thrombus. Normal compressibility and flow on color Doppler imaging. Profunda Femoral Vein: No evidence of thrombus. Normal compressibility and flow on color Doppler imaging. Femoral Vein: No evidence of thrombus. Normal compressibility, respiratory phasicity and response to augmentation. Popliteal Vein: Incompletely compressible popliteal vein. Flow  maintained. No occlusive thrombus. Wall thickening. Calf Veins: No evidence of thrombus. Normal compressibility and flow on color Doppler imaging. Superficial Great Saphenous Vein: No evidence of thrombus. Normal compressibility and flow on color Doppler imaging. Other Findings:  None. LEFT LOWER EXTREMITY Common Femoral Vein: No evidence of thrombus. Normal compressibility, respiratory phasicity and response to augmentation. Saphenofemoral Junction: No evidence of thrombus. Normal compressibility and flow on color Doppler imaging. Profunda Femoral Vein: No evidence of thrombus. Normal compressibility and flow on color Doppler imaging. Femoral Vein: No evidence of thrombus. Normal compressibility, respiratory phasicity and response to augmentation. Popliteal Vein: Incompletely compressible popliteal vein with flow maintained. Wall thickening. Calf Veins: No evidence of thrombus. Normal compressibility and flow on color Doppler imaging. Superficial Great Saphenous Vein: No evidence of thrombus. Normal compressibility and flow on color Doppler imaging. Other Findings:  None. IMPRESSION: Sonographic survey negative for DVT of the common femoral veins, femoral vein bilaterally. Incomplete compressibility of the bilateral popliteal veins, compatible with DVT. Chronicity is uncertain, and correlation with patient's prior history of DVT may be useful. Electronically Signed   By: Corrie Mckusick D.O.   On: 04/21/2018 20:18   Dg Chest Port 1 View  Result Date: 04/21/2018 CLINICAL DATA:  Positive D-dimer. Concern for pulmonary embolism. Pending nuclear medicine V/Q scan. EXAM: PORTABLE CHEST 1 VIEW COMPARISON:  04/18/2018 FINDINGS: The cardiomediastinal silhouette is within normal limits. The lungs are well inflated and clear. There is no evidence of pleural effusion or pneumothorax. No acute osseous abnormality is identified. IMPRESSION: No active disease. Electronically Signed   By: Logan Bores M.D.   On: 04/21/2018  13:54    Cardiac Studies   LHC (04/21/18): 1.  Mild nonobstructive coronary artery disease. 2.  Normal LV systolic function and normal left ventricular Joshua Ewing-diastolic pressure.  Echo (04/19/18): - Left ventricle: The cavity size was normal. Systolic function was   normal. The estimated ejection fraction was in the range of 55%   to 60%. Wall motion was normal; there were no regional wall   motion abnormalities. Doppler parameters are consistent with   abnormal left ventricular relaxation (grade 1 diastolic   dysfunction). - Left atrium: The atrium was normal in size. - Right ventricle: Systolic function was normal. - Pulmonary arteries: Systolic pressure was within the normal   range. PA peak pressure: 35 mm Hg (S).  Patient Profile     57 y.o. male with a hx of poorly controlled diabetes,obesity,paroxysmal atrial fibrillation, hypertension,fatty liver,chronic kidney disease,presenting to the hospital with tachycardia, chest pain, shortness of breathconcerning for ACS.  Catheterization showed mild CAD; saddle PE subsequently identified by V/Q scan and CTA chest.  Assessment & Plan    Submassive PE Given elevated troponin on admission in the setting of saddle PE, this constitutes a submassive PE.  Patient has been on anticoagulation since admission and is currently asymptomatic.  Given normal RV function on echo, I do not think that thrombolysis/thrombectomy would provide much benefit.  Agree with transitioning to apixaban, to be continued at least 3-6 months.  Recommend outpatient hematology evaluation to exclude hypercoagulable state that  would mandate lifelong anticoagulation.  Coronary artery disease Patient did not have an NSTEMI; elevated troponin is due to supply-demand mismatch in the setting of submassive PE.  Catheterization showed mild, non-obstructive CAD.  Medical therapy and risk factor modification to prevent progression of disease (statin therapy, weight loss,  and improved glycemic control).  Reasonable to discontinue aspirin in the setting of anticoagulation with apixaban as an outpatient.  CKD stage III Creatinine better this morning a 1.1.  Avoid nephrotoxic drugs.  Ensure adequate hydration.  For questions or updates, please contact [place] Please consult [email or web address] for contact info under [facility].     Signed, Nelva Bush, MD  04/22/2018, 11:54 AM

## 2018-04-22 NOTE — Progress Notes (Signed)
  Work note  Patient: Joshua Ewing  Patient admitted to the hospital [facility] 04/18/2018 and discharged 04/22/2018.  Please excuse from work until 04/29/2018.  May return to work without restrictions.  Dr. Alford Highlandichard Nolin Grell [phone]

## 2018-04-22 NOTE — Progress Notes (Signed)
Discharged to home with wife.  Education regarding eliquis given.  Prevention of DVTs reviewed.  Follow up appointments made for him.

## 2018-04-22 NOTE — Discharge Summary (Signed)
[facility] NAME: Joshua Ewing    MR#:  [id number]  DATE OF BIRTH:  May 01, 1961  DATE OF ADMISSION:  04/18/2018 ADMITTING PHYSICIAN: Hillary Bow, MD  DATE OF DISCHARGE: 04/22/2018 12:02 PM  PRIMARY CARE PHYSICIAN: Lovey Newcomer, DO    ADMISSION DIAGNOSIS:  Chest pain due to myocardial ischemia, unspecified ischemic chest pain type [I25.9]  DISCHARGE DIAGNOSIS:  Active Problems:   Chest pain   NSTEMI (non-ST elevated myocardial infarction) ([place])   SECONDARY DIAGNOSIS:   Past Medical History:  Diagnosis Date  . Atrial fibrillation ([place])   . CKD (chronic kidney disease)   . Diabetes ([place])   . Hypertension   . NAFL (nonalcoholic fatty liver)     HOSPITAL COURSE:   1.  Submassive pulmonary embolism with bilateral lower extremity DVT.  Patient was on heparin drip for a few days and switched over to Lovenox injections and then over to Eliquis upon discharge home.  30-day free Eliquis card given.  Patient states that he is feeling good and did not want me to proceed to a IVC filter.  Not a candidate for TPA at this point.  Echocardiogram reviewed and no strain on the echocardiogram.  Patient is hemodynamically stable.  Prior to coming off anticoagulation would recommend ultrasound of the lower extremities and CT scan of the chest.  Would need anticoagulation for at least 6 months.  Would recommend checking a hypercoagulable work-up once off anticoagulation. 2.  Elevated troponin is demand ischemia from pulmonary embolism.  Initially we thought this was myocardial infarction but this was ruled out.  The patient had a cardiac catheterization that only showed 30% blockage.  The patient was on aspirin atorvastatin and metoprolol. 3.  Type 2 diabetes mellitus.  Can restart Glucophage as outpatient in 2 days after CT scan.  Hemoglobin A1c 8.9.  I did prescribe Lantus pen 12 units at night. 4.  Hypertension on losartan and metoprolol 5.   Hyperlipidemia unspecified.  LDL at goal on atorvastatin.  Triglycerides elevated at 337.  Atorvastatin should help this also. 6.  Thrombocytopenia.  Hepatitis C is negative. 7.  Acute kidney injury on chronic kidney disease stage II.  With IV fluid hydration I did get the patient's creatinine improved to 1.09.  Follow-up as outpatient.   DISCHARGE CONDITIONS:   Satisfactory  CONSULTS OBTAINED:  Treatment Team:  Minna Merritts, MD Thompson Grayer, MD  DRUG ALLERGIES:   Allergies  Allergen Reactions  . Penicillins Hives    Has patient had a PCN reaction causing immediate rash, facial/tongue/throat swelling, SOB or lightheadedness with hypotension: No Has patient had a PCN reaction causing severe rash involving mucus membranes or skin necrosis: No Has patient had a PCN reaction that required hospitalization: No Has patient had a PCN reaction occurring within the last 10 years: Unknown If all of the above answers are "NO", then may proceed with Cephalosporin use.   . Sulfa Antibiotics Other (See Comments)    Childhood reaction: unknown    DISCHARGE MEDICATIONS:   Allergies as of 04/22/2018      Reactions   Penicillins Hives   Has patient had a PCN reaction causing immediate rash, facial/tongue/throat swelling, SOB or lightheadedness with hypotension: No Has patient had a PCN reaction causing severe rash involving mucus membranes or skin necrosis: No Has patient had a PCN reaction that required hospitalization: No Has patient had a PCN reaction occurring within the last 10 years: Unknown If all  of the above answers are "NO", then may proceed with Cephalosporin use.   Sulfa Antibiotics Other (See Comments)   Childhood reaction: unknown      Medication List    STOP taking these medications   losartan-hydrochlorothiazide 100-25 MG tablet Commonly known as:  HYZAAR     TAKE these medications   apixaban 5 MG Tabs tablet Commonly known as:  ELIQUIS 2 tabs po twice a day  for 7 days then one tab po twice a day afterwards   aspirin 81 MG chewable tablet Chew 81 mg by mouth daily.   atorvastatin 40 MG tablet Commonly known as:  LIPITOR Take 1 tablet (40 mg total) by mouth daily at 6 PM.   blood glucose meter kit and supplies Kit Dispense based on patient and insurance preference. Use up to four times daily as directed. (FOR ICD-9 250.00, 250.01).   Insulin Glargine 100 UNIT/ML Solostar Pen Commonly known as:  LANTUS Inject 12 Units into the skin at bedtime.   losartan 100 MG tablet Commonly known as:  COZAAR Take 1 tablet (100 mg total) by mouth daily.   metFORMIN 500 MG 24 hr tablet Commonly known as:  GLUCOPHAGE-XR Take 2 tablets (1,000 mg total) by mouth 2 (two) times daily. Start taking on:  04/24/2018 What changed:    when to take this  These instructions start on 04/24/2018. If you are unsure what to do until then, ask your doctor or other care provider.   metoprolol tartrate 50 MG tablet Commonly known as:  LOPRESSOR Take 1 tablet (50 mg total) by mouth 2 (two) times daily.   Pen Needles 31G X 5 MM Misc 1 each by Does not apply route at bedtime.        DISCHARGE INSTRUCTIONS:   Follow-up PMD 5 days  If you experience worsening of your admission symptoms, develop shortness of breath, life threatening emergency, suicidal or homicidal thoughts you must seek medical attention immediately by calling 911 or calling your MD immediately  if symptoms less severe.  You Must read complete instructions/literature along with all the possible adverse reactions/side effects for all the Medicines you take and that have been prescribed to you. Take any new Medicines after you have completely understood and accept all the possible adverse reactions/side effects.   Please note  You were cared for by a hospitalist during your hospital stay. If you have any questions about your discharge medications or the care you received while you were in the  hospital after you are discharged, you can call the unit and asked to speak with the hospitalist on call if the hospitalist that took care of you is not available. Once you are discharged, your primary care physician will handle any further medical issues. Please note that NO REFILLS for any discharge medications will be authorized once you are discharged, as it is imperative that you return to your primary care physician (or establish a relationship with a primary care physician if you do not have one) for your aftercare needs so that they can reassess your need for medications and monitor your lab values.    Today   CHIEF COMPLAINT:   Chief Complaint  Patient presents with  . Chest Pain  . Shortness of Breath    HISTORY OF PRESENT ILLNESS:  Joshua Ewing  is a 57 y.o. male presented with chest pain and shortness of breath.   VITAL SIGNS:  Blood pressure (!) 141/79, pulse 86, temperature 97.7 F (36.5 C), temperature source  Oral, resp. rate 14, height 5' 9"  (1.753 m), weight 109.6 kg, SpO2 96 %.   PHYSICAL EXAMINATION:  GENERAL:  57 y.o.-year-old patient lying in the bed with no acute distress.  EYES: Pupils equal, round, reactive to light and accommodation. No scleral icterus. Extraocular muscles intact.  HEENT: Head atraumatic, normocephalic. Oropharynx and nasopharynx clear.  NECK:  Supple, no jugular venous distention. No thyroid enlargement, no tenderness.  LUNGS: Normal breath sounds bilaterally, no wheezing, rales,rhonchi or crepitation. No use of accessory muscles of respiration.  CARDIOVASCULAR: S1, S2 normal. No murmurs, rubs, or gallops.  ABDOMEN: Soft, non-tender, non-distended. Bowel sounds present. No organomegaly or mass.  EXTREMITIES: No pedal edema, cyanosis, or clubbing.  NEUROLOGIC: Cranial nerves II through XII are intact. Muscle strength 5/5 in all extremities. Sensation intact. Gait not checked.  PSYCHIATRIC: The patient is alert and oriented x 3.  SKIN: No  obvious rash, lesion, or ulcer.   DATA REVIEW:   CBC Recent Labs  Lab 04/21/18 0455  WBC 7.5  HGB 14.1  HCT 41.1  PLT 121*    Chemistries  Recent Labs  Lab 04/22/18 0518  NA 139  K 4.0  CL 107  CO2 25  GLUCOSE 181*  BUN 19  CREATININE 1.09  CALCIUM 9.1    Cardiac Enzymes Recent Labs  Lab 04/19/18 0014  TROPONINI 0.48*     RADIOLOGY:  Ct Angio Chest Pe W Or Wo Contrast  Result Date: 04/22/2018 CLINICAL DATA:  Chest discomfort and difficulty breathing. Indeterminate nuclear medicine scan. EXAM: CT ANGIOGRAPHY CHEST WITH CONTRAST TECHNIQUE: Multidetector CT imaging of the chest was performed using the standard protocol during bolus administration of intravenous contrast. Multiplanar CT image reconstructions and MIPs were obtained to evaluate the vascular anatomy. CONTRAST:  87m ISOVUE-370 IOPAMIDOL (ISOVUE-370) INJECTION 76% COMPARISON:  Nuclear medicine pulmonary scan on 04/21/2018 FINDINGS: Cardiovascular: Significant pulmonary embolism present with an acute saddle thrombus crossing the main pulmonary artery bifurcation and extending into the lower lobes bilaterally with nonocclusive thrombus extending into segmental branches. No component of significant right heart strain by CTA. No pericardial fluid. No significant coronary artery calcification. Mediastinum/Nodes: No enlarged mediastinal, hilar, or axillary lymph nodes. Thyroid gland, trachea, and esophagus demonstrate no significant findings. Lungs/Pleura: There is no evidence of pulmonary edema, consolidation, pneumothorax, nodule or pleural fluid. No peripheral pulmonary infarcts identified. Upper Abdomen: The liver shows probable steatosis. The spleen may be mild to moderately enlarged. The spleen is incompletely imaged. There is likely a small hiatal hernia. Musculoskeletal: No chest wall abnormality. No acute or significant osseous findings. Review of the MIP images confirms the above findings. IMPRESSION: Acute saddle  pulmonary embolism with a saddle thrombus crossing the main pulmonary artery bifurcation and extending into both lower lobes bilaterally. Lower lobe thrombus is nonocclusive bilaterally. No evidence of right heart strain by CTA. These results were called by telephone at the time of interpretation on 04/22/2018 at 09:50 AM to Dr. RLoletha Grayer, who verbally acknowledged these results. Electronically Signed   By: GAletta EdouardM.D.   On: 04/22/2018 10:00   Nm Pulmonary Perf And Vent  Result Date: 04/21/2018 CLINICAL DATA:  Elevated D-dimer, clinically suspected low probability for pulmonary embolism, history atrial fibrillation, diabetes mellitus, hypertension, chronic kidney disease, nonalcoholic fatty liver disease EXAM: NUCLEAR MEDICINE VENTILATION - PERFUSION LUNG SCAN TECHNIQUE: Ventilation images were obtained in multiple projections using inhaled aerosol Tc-933mTPA. Perfusion images were obtained in multiple projections after intravenous injection of Tc-9989mA. RADIOPHARMACEUTICALS:  29.68 mCi of  Tc-35mDTPA aerosol inhalation and 4.48 mCi Tc-914mAA IV COMPARISON:  None Correlation: Chest radiograph 04/21/2018 FINDINGS: Ventilation: Diffusely diminished ventilation to the RIGHT upper lobe. Remaining ventilation normal. Perfusion: Diffusely diminished perfusion to the entirety of the RIGHT upper lobe. Mildly diminished perfusion diffusely in LEFT lower lobe. No other for perfusion defects. Chest radiograph: Clear lung fields bilaterally. IMPRESSION: Diminished ventilation and perfusion in the RIGHT upper lobe diffusely. Additional diminished perfusion in the LEFT lower lobe with normal ventilation. Clear lungs by chest radiograph. Findings represent an intermediate probability for pulmonary embolism; consider follow-up CTA imaging of the chest to exclude pulmonary emboli. Electronically Signed   By: MaLavonia Dana.D.   On: 04/21/2018 15:49   [place]ous Img Lower Bilateral  Result Date:  04/21/2018 CLINICAL DATA:  [age]ear old male with a history of swelling EXAM: BILATERAL LOWER EXTREMITY VENOUS DOPPLER ULTRASOUND TECHNIQUE: Gray-scale sonography with graded compression, as well as color Doppler and duplex ultrasound were performed to evaluate the lower extremity deep venous systems from the level of the common femoral vein and including the common femoral, femoral, profunda femoral, popliteal and calf veins including the posterior tibial, peroneal and gastrocnemius veins when visible. The superficial great saphenous vein was also interrogated. Spectral Doppler was utilized to evaluate flow at rest and with distal augmentation maneuvers in the common femoral, femoral and popliteal veins. COMPARISON:  None. FINDINGS: RIGHT LOWER EXTREMITY Common Femoral Vein: No evidence of thrombus. Normal compressibility, respiratory phasicity and response to augmentation. Saphenofemoral Junction: No evidence of thrombus. Normal compressibility and flow on color Doppler imaging. Profunda Femoral Vein: No evidence of thrombus. Normal compressibility and flow on color Doppler imaging. Femoral Vein: No evidence of thrombus. Normal compressibility, respiratory phasicity and response to augmentation. Popliteal Vein: Incompletely compressible popliteal vein. Flow maintained. No occlusive thrombus. Wall thickening. Calf Veins: No evidence of thrombus. Normal compressibility and flow on color Doppler imaging. Superficial Great Saphenous Vein: No evidence of thrombus. Normal compressibility and flow on color Doppler imaging. Other Findings:  None. LEFT LOWER EXTREMITY Common Femoral Vein: No evidence of thrombus. Normal compressibility, respiratory phasicity and response to augmentation. Saphenofemoral Junction: No evidence of thrombus. Normal compressibility and flow on color Doppler imaging. Profunda Femoral Vein: No evidence of thrombus. Normal compressibility and flow on color Doppler imaging. Femoral Vein: No evidence  of thrombus. Normal compressibility, respiratory phasicity and response to augmentation. Popliteal Vein: Incompletely compressible popliteal vein with flow maintained. Wall thickening. Calf Veins: No evidence of thrombus. Normal compressibility and flow on color Doppler imaging. Superficial Great Saphenous Vein: No evidence of thrombus. Normal compressibility and flow on color Doppler imaging. Other Findings:  None. IMPRESSION: Sonographic survey negative for DVT of the common femoral veins, femoral vein bilaterally. Incomplete compressibility of the bilateral popliteal veins, compatible with DVT. Chronicity is uncertain, and correlation with patient's prior history of DVT may be useful. Electronically Signed   By: JaCorrie Mckusick.O.   On: 04/21/2018 20:18   Dg Chest Port 1 View  Result Date: 04/21/2018 CLINICAL DATA:  Positive D-dimer. Concern for pulmonary embolism. Pending nuclear medicine V/Q scan. EXAM: PORTABLE CHEST 1 VIEW COMPARISON:  04/18/2018 FINDINGS: The cardiomediastinal silhouette is within normal limits. The lungs are well inflated and clear. There is no evidence of pleural effusion or pneumothorax. No acute osseous abnormality is identified. IMPRESSION: No active disease. Electronically Signed   By: AlLogan Bores.D.   On: 04/21/2018 13:54     Management plans discussed with the patient, family and they are  in agreement.  CODE STATUS:     Code Status Orders  (From admission, onward)         Start     Ordered   04/18/18 1538  Full code  Continuous     04/18/18 1539        Code Status History    This patient has a current code status but no historical code status.      TOTAL TIME TAKING CARE OF THIS PATIENT: 35 minutes.    Loletha Grayer M.D on 04/22/2018 at 2:44 PM  Between 7am to 6pm - Pager - [phone]  After 6pm go to [email or web address] - Proofreader  [facility]  [phone]  CC: Primary care physician; Lovey Newcomer, DO

## 2018-04-28 ENCOUNTER — Telehealth: Payer: Self-pay

## 2018-04-28 NOTE — Telephone Encounter (Signed)
EMMI flagged for sad, hopeless, anxious feelings. CSW attempted to call patient top discuss concerns. Patient is unavailable. CSW left voicemail for patient to call CSW back. CSW will attempt 2nd call with patient again when able.   Ruthe Mannanandace Jakayden Cancio MSW, [address] RdCSWA [phone]

## 2019-07-01 IMAGING — NM NM PULMONARY VENT & PERF
2 series · 16 of 16 positions shown · non-contrast
Comparison: None

Correlation: Chest radiograph 04/21/2018

CLINICAL DATA: Elevated D-dimer, clinically suspected low
probability for pulmonary embolism, history atrial fibrillation,
diabetes mellitus, hypertension, chronic kidney disease,
nonalcoholic fatty liver disease

EXAM:
NUCLEAR MEDICINE VENTILATION - PERFUSION LUNG SCAN
TECHNIQUE: Ventilation images were obtained in multiple projections using
inhaled aerosol 2c-NNm DTPA. Perfusion images were obtained in
multiple projections after intravenous injection of 2c-NNm MAA.
RADIOPHARMACEUTICALS:  29.68 mCi of 2c-NNm DTPA aerosol inhalation
and 4.48 mCi 2c-NNm MAA IV

[Series 1000: lung ventilation · 3.90mm/px · 4 acquisitions, 8 frames shown]
[im 1/4]
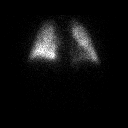
[im 1/4]
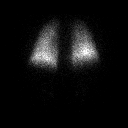
[im 2/4]
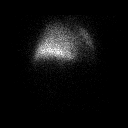
[im 2/4]
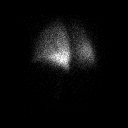
[im 3/4]
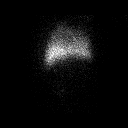
[im 3/4]
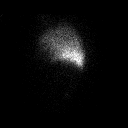
[im 4/4]
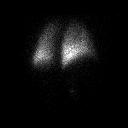
[im 4/4]
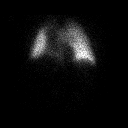

[Series 1000: lung perfusion · 1.95mm/px · 4 acquisitions, 8 frames shown]
[im 1/4]
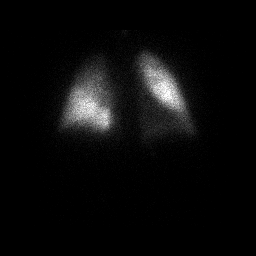
[im 1/4]
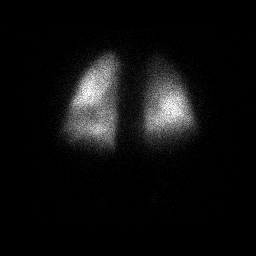
[im 2/4]
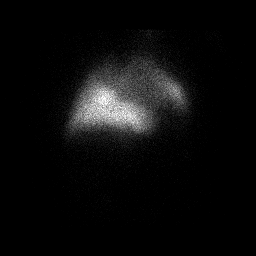
[im 2/4]
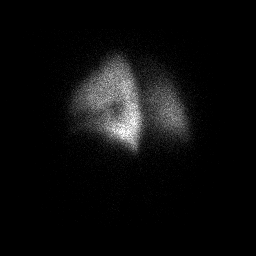
[im 3/4]
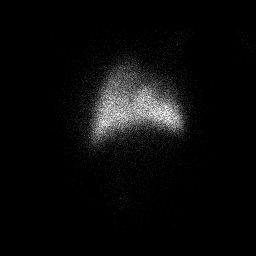
[im 3/4]
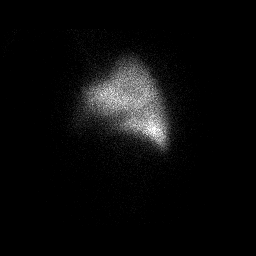
[im 4/4]
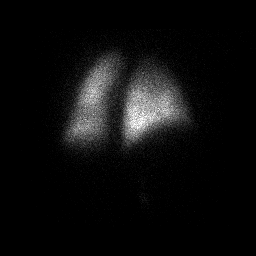
[im 4/4]
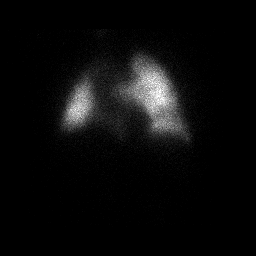

[16 of 16 positions shown; findings below may reference images not displayed]

FINDINGS: Ventilation: Diffusely diminished ventilation to the RIGHT upper
lobe. Remaining ventilation normal.

Perfusion: Diffusely diminished perfusion to the entirety of the
RIGHT upper lobe. Mildly diminished perfusion diffusely in LEFT
lower lobe. No other for perfusion defects.

Chest radiograph: Clear lung fields bilaterally.
IMPRESSION: Diminished ventilation and perfusion in the RIGHT upper lobe
diffusely.

Additional diminished perfusion in the LEFT lower lobe with normal
ventilation.

Clear lungs by chest radiograph.

Findings represent an intermediate probability for pulmonary
embolism; consider follow-up CTA imaging of the chest to exclude
pulmonary emboli.

## 2019-07-02 IMAGING — CT CT ANGIO CHEST
2 of 6 series · 18 of 46 positions shown · IV contrast (APPLIED)
Comparison: Nuclear medicine pulmonary scan on 04/21/2018

CLINICAL DATA: Chest discomfort and difficulty breathing.
Indeterminate nuclear medicine scan.

EXAM:
CT ANGIOGRAPHY CHEST WITH CONTRAST
TECHNIQUE: Multidetector CT imaging of the chest was performed using the
standard protocol during bolus administration of intravenous
contrast. Multiplanar CT image reconstructions and MIPs were
obtained to evaluate the vascular anatomy.
CONTRAST:  75mL LTZDGX-EB3 IOPAMIDOL (LTZDGX-EB3) INJECTION 76%

[Series 5: thins · axial · 0.70mm/px · z∈[-313,-46]mm · 15 of 293 slices shown]
[im 13/293  lung]
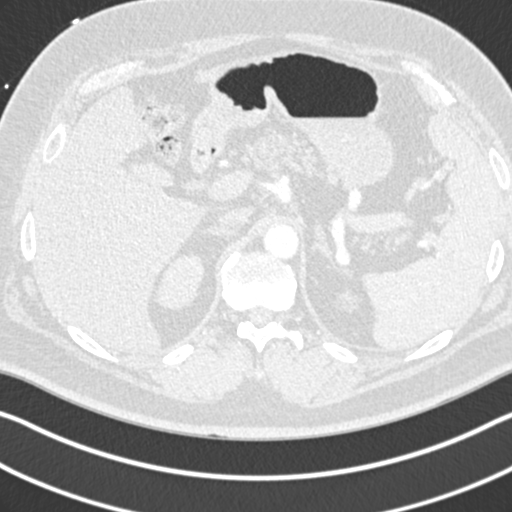
[im 39/293  soft-tissue]
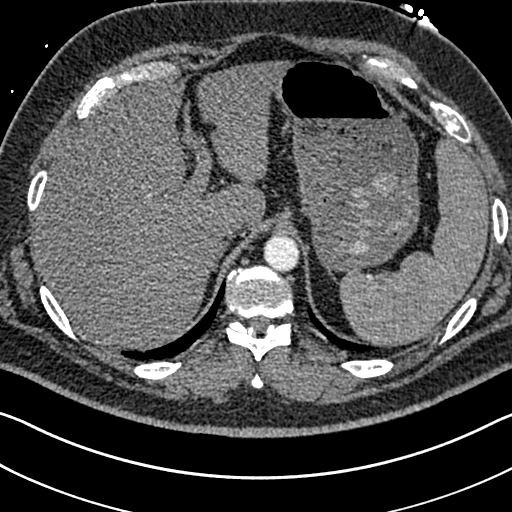
[im 51/293  lung]
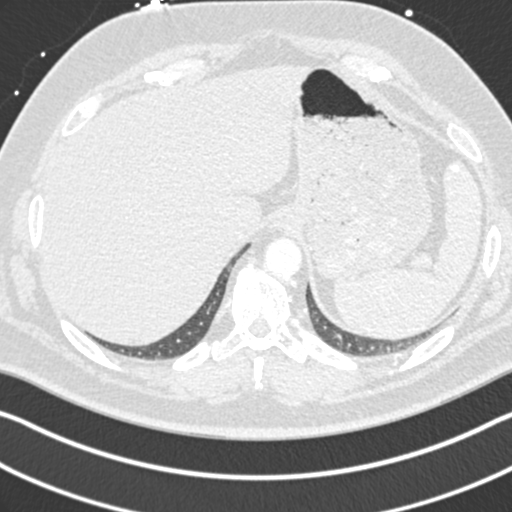
[im 77/293  soft-tissue]
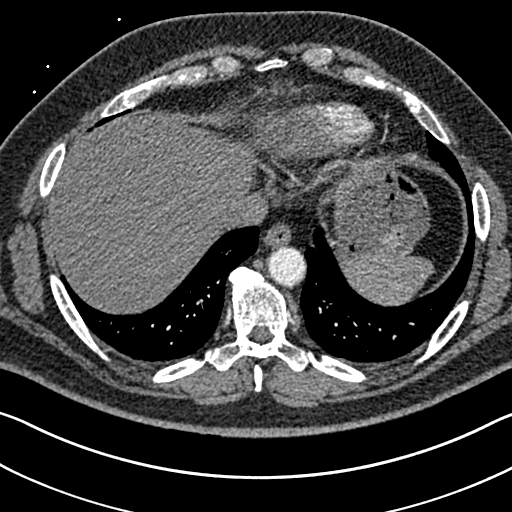
[im 89/293  lung]
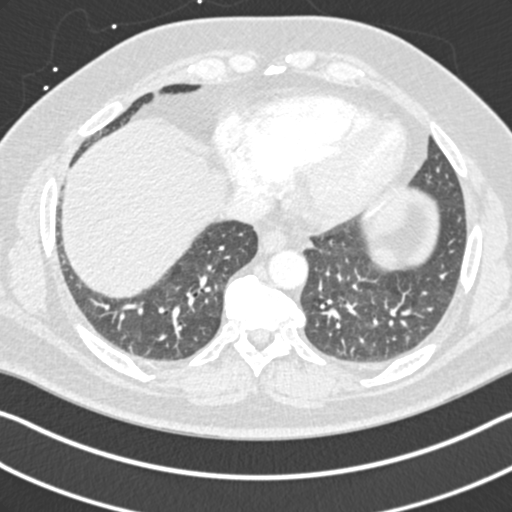
[im 115/293  soft-tissue]
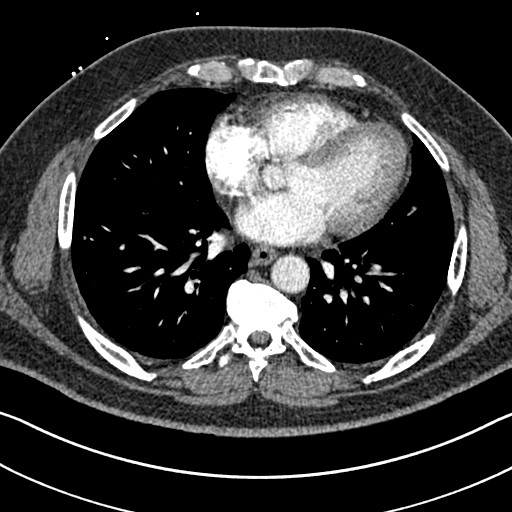
[im 127/293  lung]
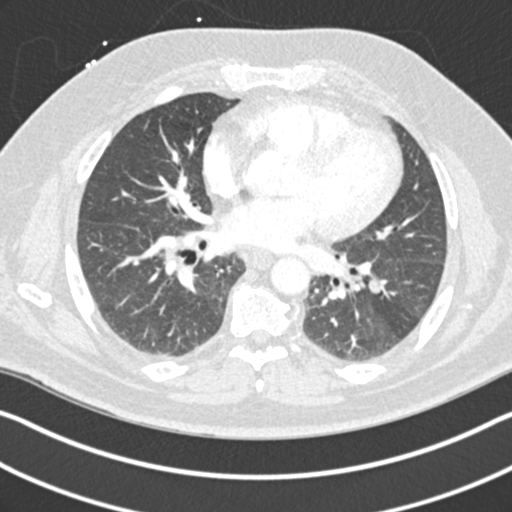
[im 153/293  soft-tissue]
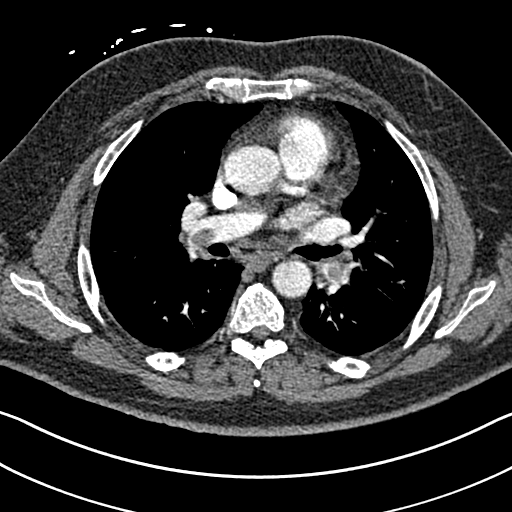
[im 166/293  lung]
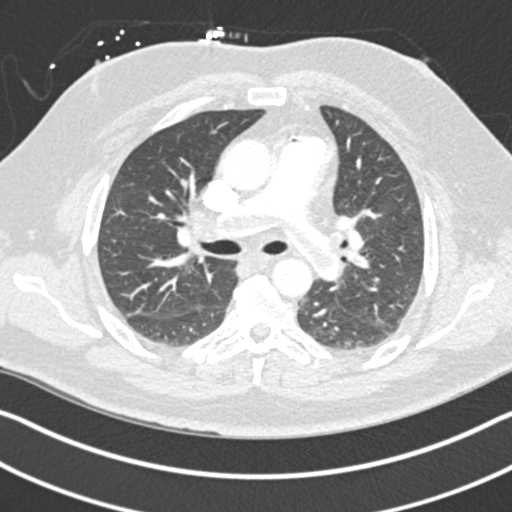
[im 178/293  soft-tissue]
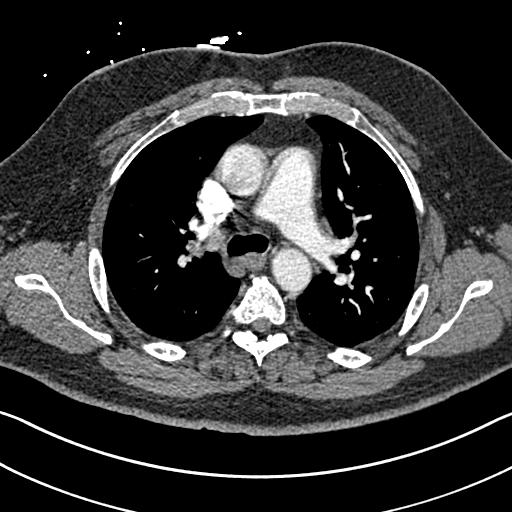
[im 204/293  lung]
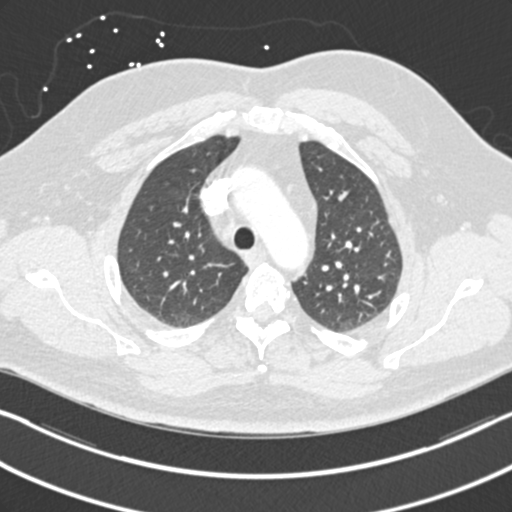
[im 216/293  soft-tissue]
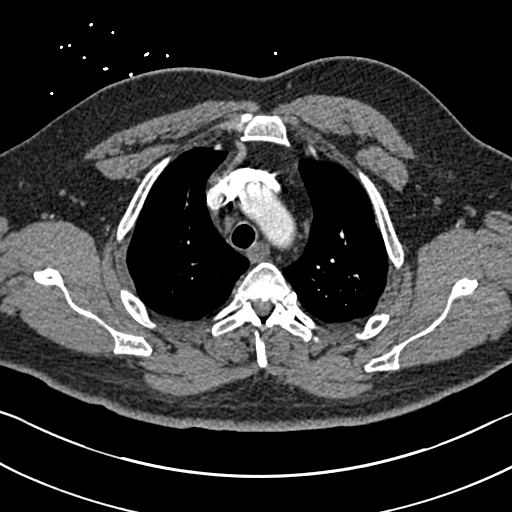
[im 242/293  lung]
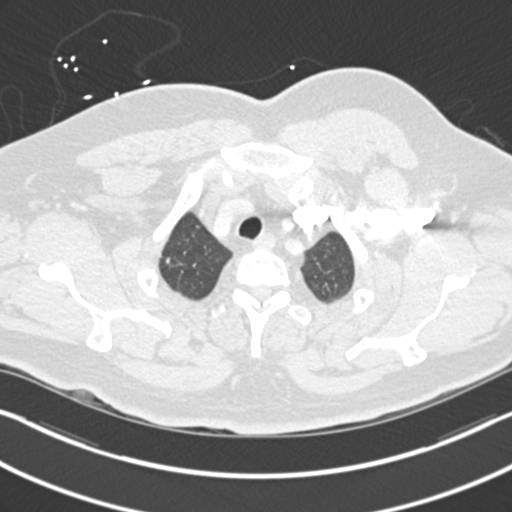
[im 254/293  soft-tissue]
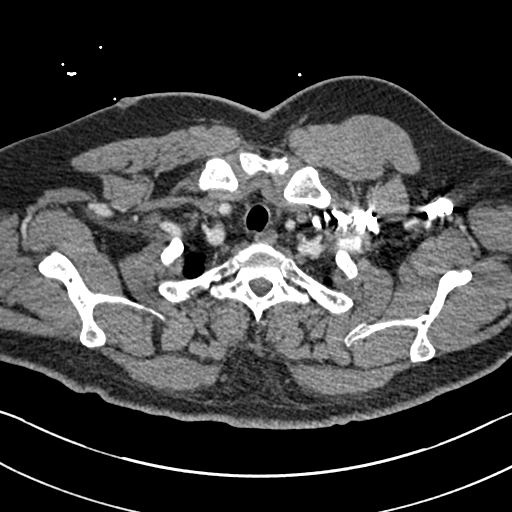
[im 280/293  lung]
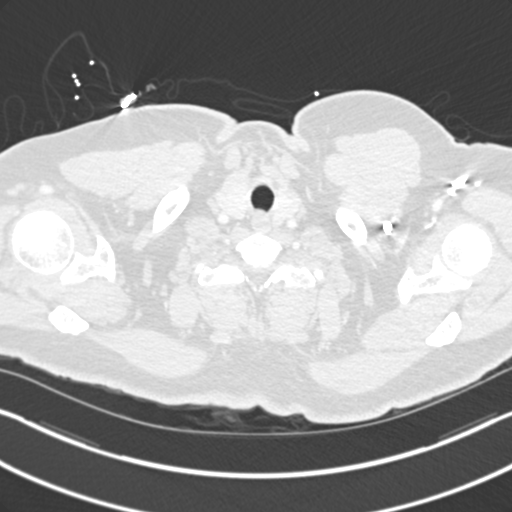

[Series 7: coronal mpr · coronal · 0.59mm/px · 3 of 111 slices shown]
[im 28/111  soft-tissue]
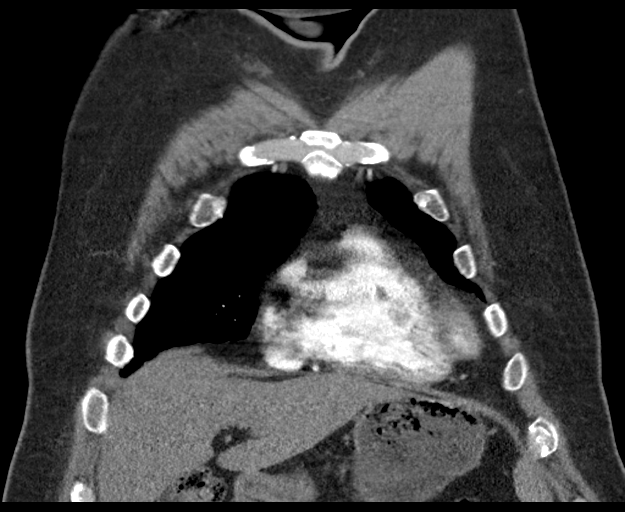
[im 56/111  soft-tissue]
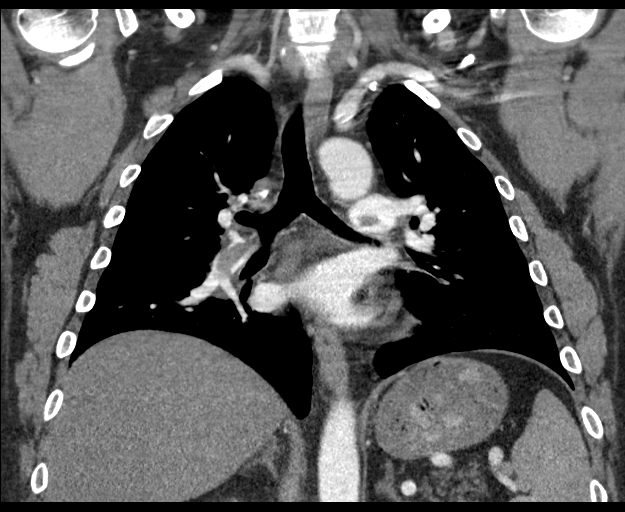
[im 83/111  soft-tissue]
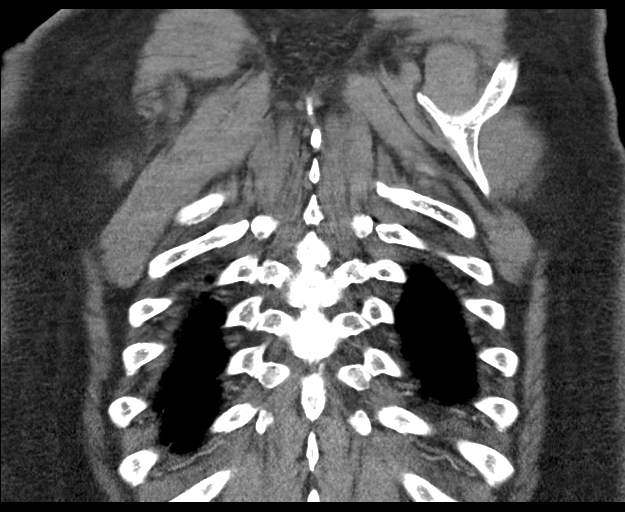

[18 of 46 positions shown; findings below may reference images not displayed]

FINDINGS: Cardiovascular: Significant pulmonary embolism present with an acute
saddle thrombus crossing the main pulmonary artery bifurcation and
extending into the lower lobes bilaterally with nonocclusive
thrombus extending into segmental branches. No component of
significant right heart strain by CTA. No pericardial fluid. No
significant coronary artery calcification.

Mediastinum/Nodes: No enlarged mediastinal, hilar, or axillary lymph
nodes. Thyroid gland, trachea, and esophagus demonstrate no
significant findings.

Lungs/Pleura: There is no evidence of pulmonary edema,
consolidation, pneumothorax, nodule or pleural fluid. No peripheral
pulmonary infarcts identified.

Upper Abdomen: The liver shows probable steatosis. The spleen may be
mild to moderately enlarged. The spleen is incompletely imaged.
There is likely a small hiatal hernia.

Musculoskeletal: No chest wall abnormality. No acute or significant
osseous findings.

Review of the MIP images confirms the above findings.
IMPRESSION: Acute saddle pulmonary embolism with a saddle thrombus crossing the
main pulmonary artery bifurcation and extending into both lower
lobes bilaterally. Lower lobe thrombus is nonocclusive bilaterally.
No evidence of right heart strain by CTA.

These results were called by telephone at the time of interpretation
acknowledged these results.
